# Patient Record
Sex: Male | Born: 1990
Health system: Southern US, Community
[De-identification: ages and names within clinical notes are randomized; demographics above are authoritative.]

## PROBLEM LIST (undated history)

## (undated) DIAGNOSIS — N189 Chronic kidney disease, unspecified: Secondary | ICD-10-CM

## (undated) HISTORY — DX: Chronic kidney disease, unspecified: N18.9

---

## 2014-02-17 ENCOUNTER — Encounter (HOSPITAL_COMMUNITY): Payer: Self-pay | Admitting: Emergency Medicine

## 2014-02-17 ENCOUNTER — Emergency Department (HOSPITAL_COMMUNITY)
Admission: EM | Admit: 2014-02-17 | Discharge: 2014-02-17 | Disposition: A | Discharge status: Home / Self Care | Attending: Emergency Medicine | Admitting: Emergency Medicine

## 2014-02-17 DIAGNOSIS — M545 Low back pain, unspecified: Secondary | ICD-10-CM | POA: Diagnosis present

## 2014-02-17 MED ORDER — OXYCODONE-ACETAMINOPHEN 5-325 MG PO TABS
1.0000 | ORAL_TABLET | Freq: Once | ORAL | Status: AC
  Administered 2014-02-17: 1 via ORAL
  Filled 2014-02-17: qty 1

## 2014-02-17 MED ORDER — DIAZEPAM 5 MG PO TABS: 5.0000 mg | ORAL_TABLET | Freq: Two times a day (BID) | ORAL | Status: DC

## 2014-02-17 MED ORDER — NAPROXEN 500 MG PO TABS: 500.0000 mg | ORAL_TABLET | Freq: Two times a day (BID) | ORAL | Status: DC

## 2014-02-17 MED ORDER — DIAZEPAM 5 MG PO TABS
5.0000 mg | ORAL_TABLET | Freq: Once | ORAL | Status: AC
  Administered 2014-02-17: 5 mg via ORAL
  Filled 2014-02-17: qty 1

## 2014-02-17 NOTE — Discharge Instructions (Signed)
Herniated Disk A herniated disk occurs when a disk in your spine bulges out too far. This condition is also called a ruptured disk or slipped disk. Your spine (backbone) is made up of bones called vertebrae. Between each pair of vertebrae is an oval disk with a soft, spongy center that acts as a shock absorber when you move. The spongy center is surrounded by a tough outer ring. When you have a herniated disk, the spongy center of the disk bulges out or ruptures through the outer ring. A herniated disk can press on a nerve between your vertebrae and cause pain. A herniated disk can occur anywhere in your back or neck area, but the lower back is the most common spot. CAUSES  In many cases, a herniated disk occurs just from getting older. As you age, the spongy insides of your disks tend to shrink and dry out. A herniated disk can result from gradual wear and tear. Injury or sudden strain can also cause a herniated disk.  RISK FACTORS Aging is the main risk factor for a herniated disk. Other risk factors include:  Being a man between the ages of 30 and 50 years.  Having a job that requires heavy lifting, bending, or twisting.  Having a job that requires long hours of driving.  Not getting enough exercise.  Being overweight.  Smoking. SIGNS AND SYMPTOMS  Signs and symptoms depend on which disk is herniated.  For a herniated disk in the lower back, you may have sharp pain in:  One part of your leg, hip, or buttocks.  The back of your calf.  The top or sole of your foot (sciatica).   For a herniated disk in the neck, you may feel pain:  When you move your neck.  Near or over your shoulder blade.  That moves to your upper arm, forearm, or fingers.   You may also have muscle weakness. It may be hard to:  Lift your leg or arm.  Stand on your toes.  Squeeze tightly with one of your hands.  Other symptoms can include:  Numbness or tingling in the affected areas of your  body.  Loss of bladder or bowel control. This is a rare but serious sign of a severe herniated disk in the lower back. DIAGNOSIS  Your health care provider will do a physical exam. During this exam, you may have to move certain body parts or assume various positions. For example, your health care provider may do the straight-leg test. This is a good way to test for a herniated disk in your lower back. In this test, the health care provider lifts your leg while you lie on your back. This is to see if you feel pain down your leg. Your health care provider will also check for numbness or loss of feeling.  Your health care provider will also check your:  Reflexes.  Muscle strength.  Posture.  Other tests may be done to help in making a diagnosis. These may include:  An X-ray of the spine to rule out other causes of back pain.   Other imaging studies, such as an MRI or CT scan. This is to check whether the herniated disk is pressing on your spinal canal.  Electromyography (EMG). This test checks the nerves that control muscles. It is sometimes used to identify the specific area of nerve involvement.  TREATMENT  In many cases, herniated disk symptoms go away over a period of days or weeks. You will most   likely be free of symptoms in 3-4 months. Treatment may include the following:  The initial treatment for a herniated disk is ashort period of rest.  Bed rest is often limited to 1 or 2 days. Resting for too long delays recovery.  If you have a herniated disk in your lower back, you should avoid sitting as much as possible because sitting increases pressure on the disk.  Medicines. These may include:   Nonsteroidal anti-inflammatory drugs (NSAIDs).  Muscle relaxants for back spasms.  Narcotic pain medicine if your pain is very bad.   Steroid injections. You may need these along the involved nerve root to help control pain. The steroid is injected in the area of the herniated disk.  It helps by reducing swelling around the disk.  Physical therapy. This may include exercises to strengthen the muscles that help support your spine.   You may need surgery if other treatments do not work.  HOME CARE INSTRUCTIONS Follow all your health care provider's instructions. These may include:  Take all medicines as directed by your health care provider.  Rest for 2 days and then start moving.  Do not sit or stand for long periods of time.  Maintain good posture when sitting and standing.  Avoid movements that cause pain, such as bending or lifting.  When you are able to start lifting things again:  Bend with your knees.  Keep your back straight.  Hold heavy objects close to your body.  If you are overweight, ask your health care provider to help you start a weight-loss program.  When you are able to start exercising, ask your health care provider how much and what type of exercise is best for you.  Work with a physical therapist on stretching and strengthening exercises for your back.  Do not wear high-heeled shoes.  Do not sleep on your belly.  Do not smoke.  Keep all follow-up visits as directed by your health care provider. SEEK MEDICAL CARE IF:  You have back or neck pain that is not getting better after 4 weeks.  You have very bad pain in your back or neck.  You develop numbness, tingling, or weakness along with pain. SEEK IMMEDIATE MEDICAL CARE IF:   You have numbness, tingling, or weakness that makes you unable to use your arms or legs.  You lose control of your bladder or bowels.  You have dizziness or fainting.  You have shortness of breath.  MAKE SURE YOU:   Understand these instructions.  Will watch your condition.  Will get help right away if you are not doing well or get worse. Document Released: 05/07/2000 Document Revised: 09/24/2013 Document Reviewed: 04/13/2013 Exit[employer] Patient Information 2015 [employer]. This information  is not intended to replace advice given to you by your health care provider. Make sure you discuss any questions you have with your health care provider.  

## 2014-02-17 NOTE — ED Provider Notes (Signed)
CSN: [id number]     Arrival date & time 02/17/14  1931 History   None    This chart was scribed for non-physician practitioner, Antony Madura, PA-C working with No att. providers found by Arlan Organ, ED Scribe. This patient was seen in room WTR6/WTR6 and the patient's care was started at 9:04 PM.   Chief Complaint  Patient presents with  . Back Pain   The history is provided by the patient. No language interpreter was used.    HPI Comments: Tony Holmes is a 23 y.o. male who presents to the Emergency Department complaining of constant, moderate lower back pain onset 8 months. Pt states pain has progressively worsened in last 2 days. He admits to intermittent shooting sensation down the L lower extremity.  He denies any recent injury or trauma but noted discomfort after playing a game of basketball. Pain is exacerbated when activating muscles. No alleviating factors at this time. [name] has not tried any OTC medications to help manage symptoms. However, he has tried ice and heat application to his back without any relief. He denies any fever or chills. No numbness, loss of sensation, or weakness to the lower extremities. No urinary or bowel incontinence. Pt denies a history of cancer or IV drug use. No known allergies to medications.  History reviewed. No pertinent past medical history. History reviewed. No pertinent past surgical history. No family history on file. History  Substance Use Topics  . Smoking status: Never Smoker   . Smokeless tobacco: Not on file  . Alcohol Use: Yes     Comment: occasional    Review of Systems  Constitutional: Negative for fever and chills.  Genitourinary: Negative for dysuria.  Musculoskeletal: Positive for back pain.  Neurological: Negative for weakness and numbness.  All other systems reviewed and are negative.   Allergies  Review of patient's allergies indicates no known allergies.  Home Medications   Prior to Admission medications    Medication Sig Start Date End Date Taking? Authorizing Provider  diazepam (VALIUM) 5 MG tablet Take 1 tablet (5 mg total) by mouth 2 (two) times daily. 02/17/14   Antony Madura, PA-C  naproxen (NAPROSYN) 500 MG tablet Take 1 tablet (500 mg total) by mouth 2 (two) times daily. 02/17/14   Antony Madura, PA-C   Triage Vitals: BP 127/60  Pulse 66  Temp(Src) 98.2 F (36.8 C) (Oral)  Resp 16  Ht 6' (1.829 m)  Wt 205 lb (92.987 kg)  BMI 27.80 kg/m2  SpO2 98%   Physical Exam  Nursing note and vitals reviewed. Constitutional: He is oriented to person, place, and time. He appears well-developed and well-nourished. No distress.  Nontoxic/nonseptic appearing  HENT:  Head: Normocephalic and atraumatic.  Eyes: Conjunctivae and EOM are normal. No scleral icterus.  Neck: Normal range of motion. Neck supple.  Cardiovascular: Normal rate, regular rhythm and intact distal pulses.   DP and PT pulses 2+ bilaterally  Pulmonary/Chest: Effort normal. No respiratory distress.  Musculoskeletal: Normal range of motion.  No tenderness to palpation to the thoracic or lumbar midline. No bony deformities, step-off, or crepitus. Normal range of motion back appreciated. No distinct paraspinal muscle tenderness. No spasm. Patient has a positive straight leg raise and crossed straight-leg raise which elicits pain in left low back  Neurological: He is alert and oriented to person, place, and time. He exhibits normal muscle tone. Coordination normal.  Sensation to light touch intact. Patellar and Achilles reflexes 2+ bilaterally. Patient ambulatory with normal gait.  GCS 15.  Skin: Skin is warm and dry. No rash noted. He is not diaphoretic. No erythema. No pallor.  Psychiatric: He has a normal mood and affect. His behavior is normal.    ED Course  Procedures (including critical care time)  DIAGNOSTIC STUDIES: Oxygen Saturation is 98% on RA, Normal by my interpretation.    COORDINATION OF CARE: 9:04 PM-Discussed  treatment plan with pt at bedside and pt agreed to plan.     Labs Review Labs Reviewed - No data to display  Imaging Review No results found.   EKG Interpretation None      MDM   Final diagnoses:  Left low back pain, with sciatica presence unspecified    23 year old male presents to the emergency department for back pain. Back pain has been persistent over the last few months, but worsening over the last 3 days. Patient is neurovascularly intact. No direct trauma or injury to back, per patient. Patient ambulates with normal gait. No red flags or signs concerning for cauda equina. Positive straight leg raise and crossed straight-leg raise suggest bulging disc as cause of pain today. Patient will be discharged with supportive treatment as well as orthopedic followup. Return precautions discussed and provided. Patient agreeable to plan with no unaddressed concerns. Patient discharged in good condition; VSS.  I personally performed the services described in this documentation, which was scribed in my presence. The recorded information has been reviewed and is accurate.    Filed Vitals:   02/17/14 1939  BP: 127/60  Pulse: 66  Temp: 98.2 F (36.8 C)  TempSrc: Oral  Resp: 16  Height: 6' (1.829 m)  Weight: 205 lb (92.987 kg)  SpO2: 98%     Antony Madura, PA-C 02/17/14 2110

## 2014-02-17 NOTE — ED Notes (Addendum)
Pt reports onset lower back pain on Friday, started after finishing a game of basketball. Pt denies any known trauma/ injury to the area. Pt states the pain is constant and becomes sharp when he moves a certain way. Pain better when walking, worse with sitting still. Pt ambulatory with steady gait, NAD noted.

## 2014-02-17 NOTE — ED Provider Notes (Signed)
Medical screening examination/treatment/procedure(s) were performed by non-physician practitioner and as supervising physician I was immediately available for consultation/collaboration.   EKG Interpretation None        Deatrice Spanbauer, MD 02/17/14 2324 

## 2015-01-20 ENCOUNTER — Ambulatory Visit
Admission: RE | Admit: 2015-01-20 | Discharge: 2015-01-20 | Disposition: A | Discharge status: Home / Self Care | Payer: No Typology Code available for payment source | Source: Ambulatory Visit | Attending: Occupational Medicine | Admitting: Occupational Medicine

## 2015-01-20 ENCOUNTER — Other Ambulatory Visit: Payer: Self-pay | Admitting: Occupational Medicine

## 2015-01-20 DIAGNOSIS — Z021 Encounter for pre-employment examination: Secondary | ICD-10-CM

## 2015-12-15 ENCOUNTER — Ambulatory Visit (HOSPITAL_COMMUNITY)
Admission: EM | Admit: 2015-12-15 | Discharge: 2015-12-15 | Disposition: A | Discharge status: Home / Self Care | Payer: Worker's Compensation | Attending: Internal Medicine | Admitting: Internal Medicine

## 2015-12-15 ENCOUNTER — Encounter (HOSPITAL_COMMUNITY): Payer: Self-pay | Admitting: Emergency Medicine

## 2015-12-15 DIAGNOSIS — S0501XA Injury of conjunctiva and corneal abrasion without foreign body, right eye, initial encounter: Secondary | ICD-10-CM

## 2015-12-15 MED ORDER — TETRACAINE HCL 0.5 % OP SOLN
OPHTHALMIC | Status: AC
  Filled 2015-12-15: qty 2

## 2015-12-15 MED ORDER — CIPROFLOXACIN HCL 0.3 % OP OINT: TOPICAL_OINTMENT | OPHTHALMIC | 0 refills | Status: AC

## 2015-12-15 MED ORDER — CIPROFLOXACIN HCL 0.3 % OP SOLN: 1.0000 [drp] | Freq: Four times a day (QID) | OPHTHALMIC | 0 refills | Status: DC

## 2015-12-15 NOTE — ED Triage Notes (Signed)
The patient presented to the [facility] with a complaint of a possible foreign object in his right eye. The patient stated that a piece of debris fell off of a ladder and landed in his eye. He stated that he did flush it with water but it appeared that it is still in the eye.

## 2016-11-30 IMAGING — CR DG CHEST 1V
1 series · 1 of 1 positions shown · non-contrast
Comparison: None.

CLINICAL DATA: Pre-employment physical examination

EXAM:
CHEST  1 VIEW

[w chest pa]
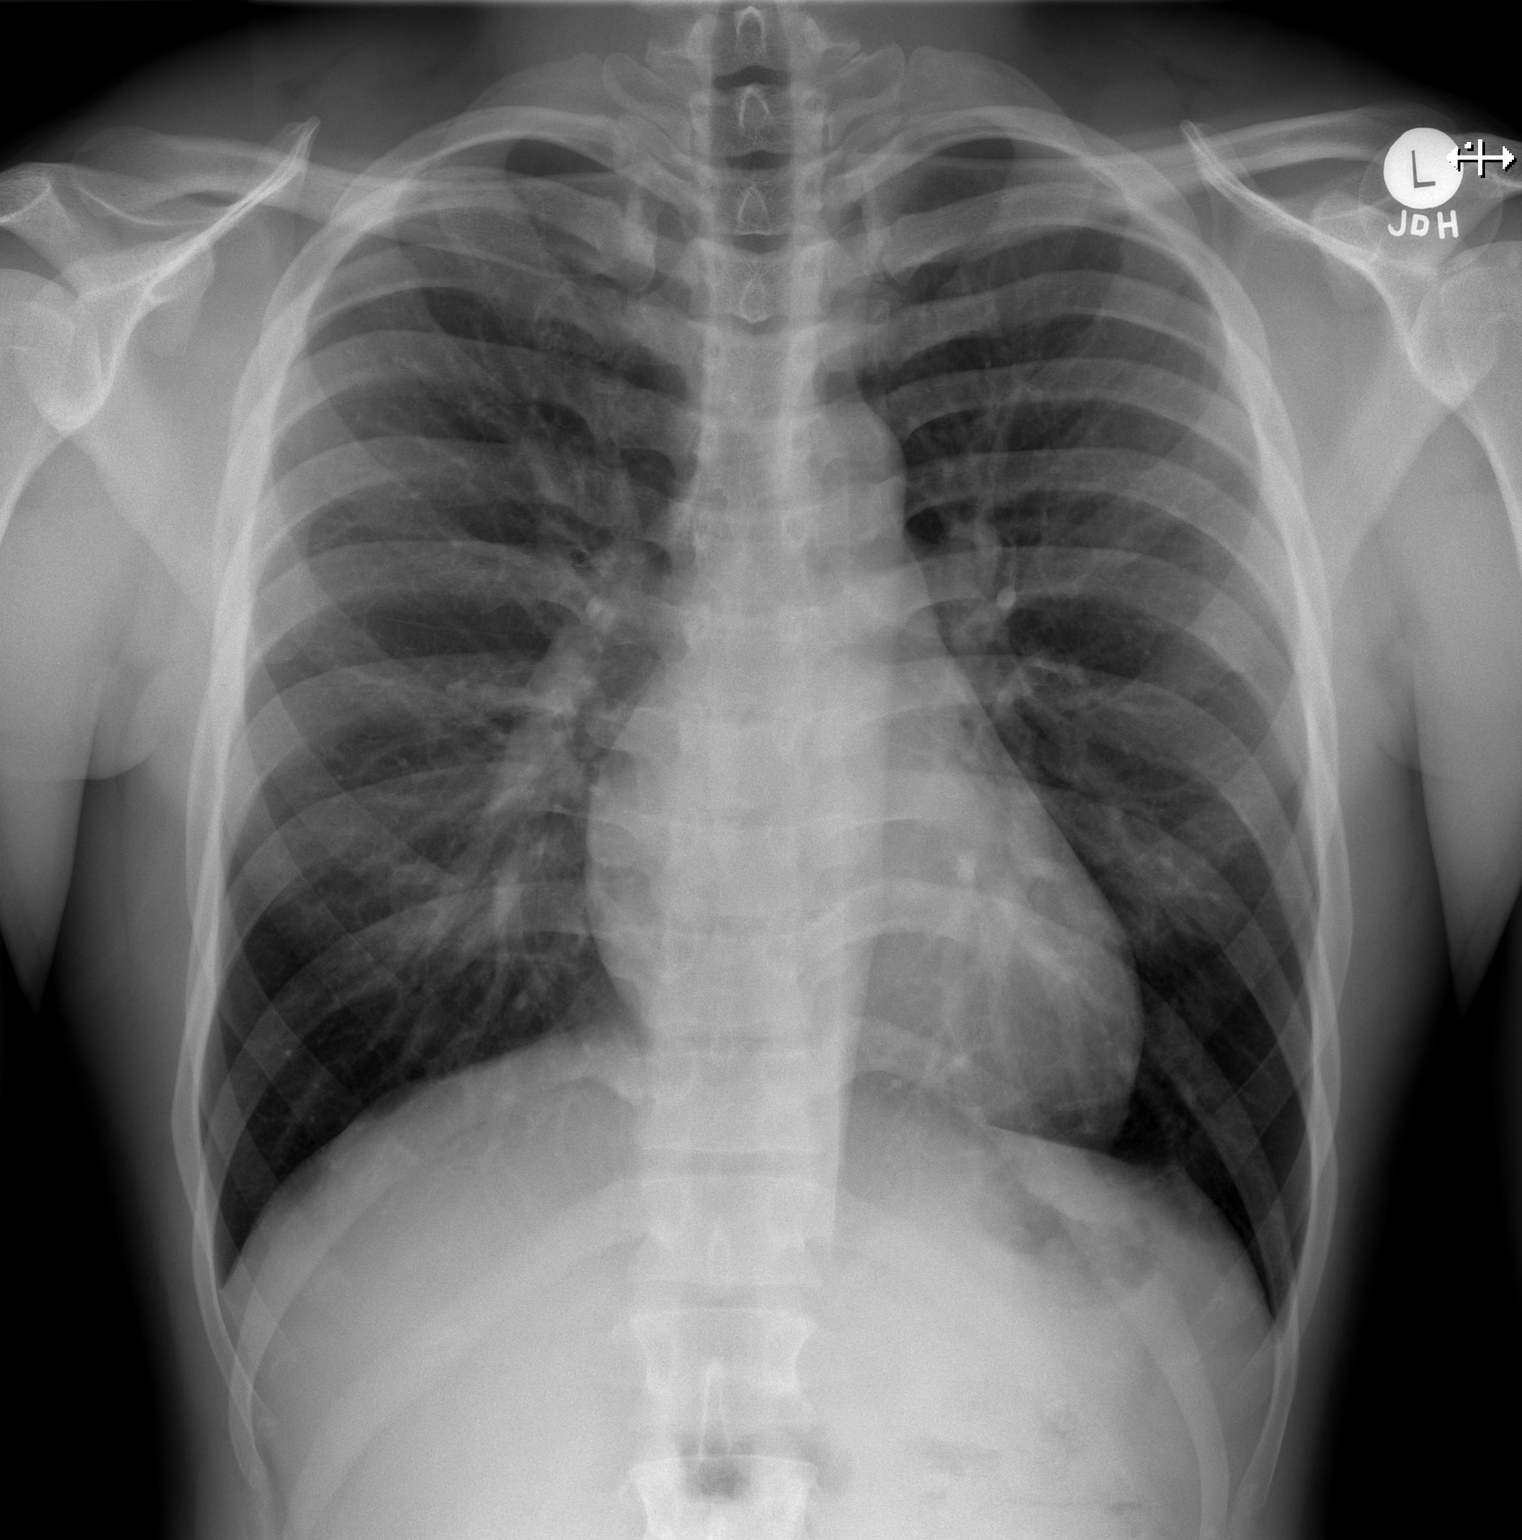

[1 of 1 positions shown; findings below may reference images not displayed]

FINDINGS: Lungs are clear. Heart size and pulmonary vascularity are normal. No
adenopathy. No bone lesions.
IMPRESSION: No abnormality noted.

## 2019-10-03 ENCOUNTER — Ambulatory Visit: Payer: 59 | Attending: Family Medicine | Admitting: Physical Therapy

## 2019-10-03 ENCOUNTER — Other Ambulatory Visit: Payer: Self-pay

## 2019-10-03 DIAGNOSIS — M5442 Lumbago with sciatica, left side: Secondary | ICD-10-CM | POA: Insufficient documentation

## 2019-10-03 DIAGNOSIS — G8929 Other chronic pain: Secondary | ICD-10-CM | POA: Diagnosis present

## 2019-10-03 DIAGNOSIS — M6281 Muscle weakness (generalized): Secondary | ICD-10-CM | POA: Insufficient documentation

## 2019-10-03 NOTE — Therapy (Signed)
[facility] [address] [place], [place], [address] Phone: [phone]   Fax:  [phone]  Physical Therapy Evaluation  Patient Details  Name: Tony Holmes MRN: [id number] Date of Birth: Sep 16, 1990 Referring Provider (PT): Collie Siad   Encounter Date: 10/03/2019  PT End of Session - 10/03/19 0854    Visit Number  1    Date for PT Re-Evaluation  12/03/19    PT Start Time  0755    PT Stop Time  0845    PT Time Calculation (min)  50 min    Activity Tolerance  Patient tolerated treatment well    Behavior During Therapy  [facility] for tasks assessed/performed       No past medical history on file.  No past surgical history on file.  There were no vitals filed for this visit.   Subjective Assessment - 10/03/19 0803    Subjective  Pt reports LBP beginning 2014/2015 while working for [employer]. 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 Pt stated that this last time he hurt his back was after sitting for a long period on a drive; started feeling tightness in his back again. Pt really noticed the pain when he was going over potholes. Pt stated that he has been told that he has a bulging disc. Pt states that he does have radiating pain in LLE down posterior part of leg. Pt report no numbness/tingling. Pt reports he is having trouble putting on socks and bending over to get things d/t pain.    Limitations  Sitting;Lifting;Standing;Walking;House hold activities    How long can you sit comfortably?  <15 minutes    How long can you stand comfortably?  <1 hour    How long can you walk comfortably?  depends; sometimes <5 minutes before muscle spasm    Patient Stated Goals  get rid of pain; be able to go through work day without back problems    Currently in Pain?  Yes    Pain Score  1     Pain Location  Back    Pain Orientation  Left;Lower    Pain Descriptors / Indicators   Sharp;Aching    Pain Type  Chronic pain    Pain Radiating Towards  LLE posterior thigh    Pain Onset  More than a month ago    Pain Frequency  Intermittent    Aggravating Factors   bending, lifting, prolonged sitting, walking    Pain Relieving Factors  ice, heat, ibuprofen         [facility] PT Assessment - 10/03/19 0001      Assessment   Medical Diagnosis  LBP    Referring Provider (PT)  Collie Siad    Prior Therapy  None      Precautions   Precautions  None      Restrictions   Weight Bearing Restrictions  No      Balance Screen   Has the patient fallen in the past 6 months  No    Has the patient had a decrease in activity level because of a fear of falling?   No    Is the patient reluctant to leave their home because of a fear of falling?   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[employer]    Additional Comments  reports no N/T      Functional Tests   Functional tests  Squat;Single Leg Squat;Single leg stance      Squat   Comments  some shaking at bottom of squat noted      Single Leg Squat   Comments  adduction of knee B       Single Leg Stance   Comments  unable to hold for 10 sec on L      ROM / Strength   AROM / PROM / Strength  AROM;Strength      AROM   Overall AROM Comments  Lumbar flexion to knees before onset of pain, reports of radiating pain with lumbar extension, lumbar/hip/knee ROM otherwise [facility]      Strength   Overall Strength Comments  LE 5/5 globally, B hip extension/abduction 4/5      Flexibility   Soft Tissue Assessment /Muscle Length  yes    Hamstrings  very limited on L, illicits pain in L LB    Quadriceps  mildly impaired B    ITB  WFL    Piriformis  WFL B      Palpation    Palpation comment  mildly tender to palpation L glute/piriformis      Ambulation/Gait   Gait Comments  Gait WFL                Objective measurements completed on examination: See above findings.      [facility] Adult PT Treatment/Exercise - 10/03/19 0001      Exercises   Exercises  Lumbar;Knee/Hip      Lumbar Exercises: Quadruped   Opposite Arm/Leg Raise  Right arm/Left leg;Left arm/Right leg;10 reps;3 seconds    Opposite Arm/Leg Raise Limitations  Modified to just arm then just leg d/t instability and core weakness    Plank  side plank on elbows    Other Quadruped Lumbar Exercises  child's pose fwd/lateral      Knee/Hip Exercises: Stretches   Active Hamstring Stretch  Both;30 seconds    Active Hamstring Stretch Limitations  long sitting             PT Education - 10/03/19 0852    Education Details  Pt educated on condition, rehab process, and HEP    Person(s) Educated  Patient    Methods  Explanation;Demonstration;Handout    Comprehension  Verbalized understanding;Returned demonstration       PT Short Term Goals - 10/03/19 1032      PT SHORT TERM GOAL #1   Title  Pt will be independent with HEP    Time  2    Period  Weeks    Status  New    Target Date  10/17/19        PT Long Term Goals - 10/03/19 1032      PT LONG TERM GOAL #1   Title  Pt will demonstrate lumbar flexion WFL and no reports of increased pain    Time  6    Period  Weeks    Status  New    Target Date  11/14/19      PT LONG TERM GOAL #2   Title  Pt will report resolution of radiating pain  in LLE    Time  6    Period  Weeks    Status  New    Target Date  11/14/19      PT LONG TERM GOAL #3   Title  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 Pt demonstrates limited and painful lumbar flexion, mod hamstring tightness/pain on L, and core weakness. 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 Pt would benefit from skilled PT to address the above functional deficits.    Personal Factors and Comorbidities  Fitness;Past/Current Experience;Profession    Examination-Activity Limitations  Bend;Carry;Lift;Stand;Squat;Sit    Examination-Participation Restrictions  Community Activity;Driving;Yard Work    Public affairs consultant  Low    Rehab Potential  Excellent    PT Frequency  2x / week    PT Duration  6 weeks    PT Treatment/Interventions  ADLs/Self Care Home Management;Electrical Stimulation;Iontophoresis 4mg /ml Dexamethasone;Moist Heat;Traction;Neuromuscular re-education;Balance training;Therapeutic exercise;Therapeutic activities;Functional mobility training;Gait training;Patient/family education;Manual techniques;Dry needling;Passive range of motion    PT Next Visit Plan  Review HEP, Initiate LE strengthening/flexibility, core/lumbar stab ex's, manual/modalities as indicated    PT Home Exercise Plan  bird dog, child's pose fwd/lat, hamstring stretch longsitting, side plank on elbow    Consulted and Agree with Plan of Care  Patient       Patient will benefit from skilled therapeutic intervention in order to improve the following deficits and impairments:  Decreased range of motion, Difficulty walking, Decreased endurance, Increased muscle spasms, Decreased activity tolerance, Pain, Decreased balance, Hypomobility, Impaired flexibility, Improper body mechanics, Decreased mobility, Decreased  strength  Visit Diagnosis: Muscle weakness (generalized)  Chronic left-sided low back pain with left-sided sciatica     Problem List There are no problems to display for this patient.  Amador Cunas, PT, DPT Donald Prose Harbert Fitterer 10/03/2019, 10:37 AM  [address] [place], [place], [address] Phone: [phone]   Fax:  [phone]  Name: Tony Holmes MRN: [id number] Date of Birth: Jul 31, 1990

## 2019-10-03 NOTE — Addendum Note (Signed)
Addended by: Marin Roberts on: 10/03/2019 10:39 AM   Modules accepted: Orders

## 2019-10-03 NOTE — Patient Instructions (Signed)
Access Code: [email or web address] URL: [email or web address] Date: 10/03/2019 Prepared by: Lysle Rubens  Exercises Child's Pose Stretch - 1 x daily - 7 x weekly - 3 sets - 3 reps - 20 sec hold Child's Pose with Sidebending - 1 x daily - 7 x weekly - 3 sets - 3 reps - 20 sec hold Bird Dog - 1 x daily - 7 x weekly - 3 sets - 10 reps - 3 sec hold Side Plank on Elbow - 1 x daily - 7 x weekly - 3 sets - 5 reps - 20 sec hold Seated Table Hamstring Stretch - 1 x daily - 7 x weekly - 3 sets - 3 reps - 30 sec hold

## 2019-10-04 ENCOUNTER — Ambulatory Visit: Payer: Commercial Managed Care - HMO | Admitting: Family Medicine

## 2019-10-09 ENCOUNTER — Encounter: Payer: Self-pay | Admitting: Physical Therapy

## 2019-10-09 ENCOUNTER — Other Ambulatory Visit: Payer: Self-pay

## 2019-10-09 ENCOUNTER — Ambulatory Visit: Payer: 59 | Admitting: Physical Therapy

## 2019-10-09 DIAGNOSIS — M6281 Muscle weakness (generalized): Secondary | ICD-10-CM

## 2019-10-09 DIAGNOSIS — G8929 Other chronic pain: Secondary | ICD-10-CM

## 2019-10-09 NOTE — Therapy (Signed)
[place], [place], [address] Phone: [phone]   Fax:  [phone]  Physical Therapy Treatment  Patient Details  Name: Reuven Braver MRN: [id number] Date of Birth: Mar 26, 1991 Referring Provider (PT): Delia Chimes   Encounter Date: 10/09/2019  PT End of Session - 10/09/19 1611    Visit Number  2    Date for PT Re-Evaluation  12/03/19    PT Start Time  5809    PT Stop Time  1612    PT Time Calculation (min)  41 min    Activity Tolerance  Patient tolerated treatment well    Behavior During Therapy  [facility] for tasks assessed/performed       History reviewed. No pertinent past medical history.  History reviewed. No pertinent surgical history.  There were no vitals filed for this visit.  Subjective Assessment - 10/09/19 1535    Subjective  Patient reports that the stretches he was given have been good, reports struggling with the HS stretches    Currently in Pain?  No/denies                        [facility] Adult PT Treatment/Exercise - 10/09/19 0001      Lumbar Exercises: Stretches   Passive Hamstring Stretch  Right;Left;4 reps;20 seconds    Quad Stretch  Right;Left;3 reps;10 seconds    Quad Stretch Limitations  in prone    Piriformis Stretch  Right;Left;3 reps;20 seconds      Lumbar Exercises: Aerobic   Elliptical  I=10, R=6 x 5 minutes      Lumbar Exercises: Machines for Strengthening   Leg Press  80# 2x15    Other Lumbar Machine Exercise  seated row 55, lats 55# 2x15, 35# AR press    Other Lumbar Machine Exercise  15# straight arm pulls 2x15, 15# hip extension and abduction      Lumbar Exercises: Prone   Other Prone Lumbar Exercises  prone on elbows 1 minute, then 3 press ups hold 3 seconds               PT Short Term Goals - 10/09/19 1614      PT SHORT TERM GOAL #1   Title  Pt will be independent with HEP    Status  Achieved        PT Long Term  Goals - 10/03/19 1032      PT LONG TERM GOAL #1   Title  Pt will demonstrate lumbar flexion WFL and no reports of increased pain    Time  6    Period  Weeks    Status  New    Target Date  11/14/19      PT LONG TERM GOAL #2   Title  Pt will report resolution of radiating pain in LLE    Time  6    Period  Weeks    Status  New    Target Date  11/14/19      PT LONG TERM GOAL #3   Title  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  Tried some prone on elbows and some prone press ups.  He reported feeling good when he left.    PT Next Visit Plan  continue what we started today and add education for body mechanics with job and ADL's    Consulted and Agree with Plan of Care  Patient       Patient will benefit from skilled therapeutic intervention in order to improve the following deficits and impairments:  Decreased range of motion, Difficulty walking, Decreased endurance, Increased muscle spasms, Decreased activity tolerance, Pain, Decreased balance, Hypomobility, Impaired flexibility, Improper body mechanics, Decreased mobility, Decreased strength  Visit Diagnosis: Chronic left-sided low back pain with left-sided sciatica  Muscle weakness (generalized)     Problem List There are no problems to display for this patient.   Jearld Lesch., PT 10/09/2019, 4:15 PM  [facility] [place] [address] [place], [place], [address] Phone: [phone]   Fax:  [phone]  Name: Casimiro Lienhard MRN: [id number] Date of Birth: 03/17/1991

## 2019-10-15 ENCOUNTER — Other Ambulatory Visit: Payer: Self-pay

## 2019-10-15 ENCOUNTER — Ambulatory Visit: Payer: 59 | Admitting: Physical Therapy

## 2019-10-15 ENCOUNTER — Encounter: Payer: Self-pay | Admitting: Physical Therapy

## 2019-10-15 DIAGNOSIS — M6281 Muscle weakness (generalized): Secondary | ICD-10-CM | POA: Diagnosis not present

## 2019-10-15 DIAGNOSIS — M5442 Lumbago with sciatica, left side: Secondary | ICD-10-CM

## 2019-10-15 NOTE — Therapy (Signed)
[facility] [place] [address] [place], [place], [address] Phone: [phone]   Fax:  [phone]  Physical Therapy Treatment  Patient Details  Name: Tony Holmes MRN: [id number] Date of Birth: Feb 18, 1991 Referring Provider (PT): Collie Siad   Encounter Date: 10/15/2019  PT End of Session - 10/15/19 1357    Visit Number  3    Date for PT Re-Evaluation  12/03/19    PT Start Time  1315    PT Stop Time  1400    PT Time Calculation (min)  45 min    Activity Tolerance  Patient tolerated treatment well    Behavior During Therapy  [facility] for tasks assessed/performed       History reviewed. No pertinent past medical history.  History reviewed. No pertinent surgical history.  There were no vitals filed for this visit.  Subjective Assessment - 10/15/19 1316    Subjective  Pt reports HEP has been going well; is a little stiff from driving car this weekend but otherwise feeling good.    Currently in Pain?  No/denies    Pain Score  0-No pain    Pain Location  Back                        [facility] Adult PT Treatment/Exercise - 10/15/19 0001      Lumbar Exercises: Aerobic   Elliptical  I=10, R=6 x 4 min fwd/3 min bkwd      Lumbar Exercises: Machines for Strengthening   Cybex Knee Extension  35# 2x15    Cybex Knee Flexion  55# 2x15    Leg Press  80# 2x15    Other Lumbar Machine Exercise  seated row 55, lats 55# 2x15, 35# AR press, heel raises 80# 2x15    Other Lumbar Machine Exercise  15# standing shoulder extension 2x15 B      Lumbar Exercises: Supine   Dead Bug  20 reps;3 seconds    Single Leg Bridge  20 reps;3 seconds      Lumbar Exercises: Quadruped   Plank  plank with 30 sec hold               PT Short Term Goals - 10/09/19 1614      PT SHORT TERM GOAL #1   Title  Pt will be independent with HEP    Status  Achieved        PT Long Term Goals - 10/03/19 1032      PT LONG TERM GOAL #1    Title  Pt will demonstrate lumbar flexion WFL and no reports of increased pain    Time  6    Period  Weeks    Status  New    Target Date  11/14/19      PT LONG TERM GOAL #2   Title  Pt will report resolution of radiating pain in LLE    Time  6    Period  Weeks    Status  New    Target Date  11/14/19      PT LONG TERM GOAL #3   Title  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 Verbal cues for form with some ex's. Continue to progress next rx.    PT Treatment/Interventions  ADLs/Self Care Home Management;Electrical Stimulation;Iontophoresis 4mg /ml Dexamethasone;Moist Heat;Traction;Neuromuscular re-education;Balance training;Therapeutic exercise;Therapeutic activities;Functional mobility training;Gait training;Patient/family education;Manual techniques;Dry needling;Passive range of motion    PT Next Visit Plan  continue what we started today and add education for body mechanics with job and ADL's    Consulted and Agree with Plan of Care  Patient       Patient will benefit from skilled therapeutic intervention in order to improve the following deficits and impairments:  Decreased range of motion, Difficulty walking, Decreased endurance, Increased muscle spasms, Decreased activity tolerance, Pain, Decreased balance, Hypomobility, Impaired flexibility, Improper body mechanics, Decreased mobility, Decreased strength  Visit Diagnosis: Chronic left-sided low back pain with left-sided sciatica  Muscle weakness (generalized)     Problem  List There are no problems to display for this patient.  Amador Cunas, PT, DPT Donald Prose Ishaaq Penna 10/15/2019, 2:00 PM  [place] [address] [place], [place], [address] Phone: [phone]   Fax:  [phone]  Name: Christoffer Currier MRN: [id number] Date of Birth: 01/15/1991

## 2019-10-17 ENCOUNTER — Other Ambulatory Visit: Payer: Self-pay

## 2019-10-17 ENCOUNTER — Ambulatory Visit: Payer: 59 | Admitting: Physical Therapy

## 2019-10-17 ENCOUNTER — Encounter: Payer: Self-pay | Admitting: Physical Therapy

## 2019-10-17 DIAGNOSIS — G8929 Other chronic pain: Secondary | ICD-10-CM

## 2019-10-17 DIAGNOSIS — M6281 Muscle weakness (generalized): Secondary | ICD-10-CM

## 2019-10-17 NOTE — Therapy (Signed)
[facility] [place] [address] [place], [place], [address] Phone: [phone]   Fax:  [phone]  Physical Therapy Treatment  Patient Details  Name: Tony Holmes MRN: [id number] Date of Birth: 1991/05/05 Referring Provider (PT): Collie Siad   Encounter Date: 10/17/2019  PT End of Session - 10/17/19 1358    Visit Number  4    Date for PT Re-Evaluation  12/03/19    PT Start Time  1315    PT Stop Time  1400    PT Time Calculation (min)  45 min    Activity Tolerance  Patient tolerated treatment well    Behavior During Therapy  [facility] for tasks assessed/performed       Past Medical History:  Diagnosis Date  . Chronic kidney disease     History reviewed. No pertinent surgical history.  There were no vitals filed for this visit.  Subjective Assessment - 10/17/19 1311    Subjective  Pt reports no LBP and no stiffness while driving since last rx.    Currently in Pain?  No/denies    Pain Score  0-No pain    Pain Location  Back                        [facility] Adult PT Treatment/Exercise - 10/17/19 0001      Lumbar Exercises: Stretches   Double Knee to Chest Stretch  3 reps;20 seconds    Double Knee to Chest Stretch Limitations  with rotation stretch for piriformis      Lumbar Exercises: Aerobic   Elliptical  I=10, R=6 x 4 min fwd/3 min bkwd      Lumbar Exercises: Machines for Strengthening   Cybex Knee Extension  35# 2x15    Cybex Knee Flexion  55# 2x15    Other Lumbar Machine Exercise  seated row 55, lats 55# 2x15, 35# AR press, heel raises 80# 2x15, lumbar extenstion 2x15 black TB, chest press 55# 2x15    Other Lumbar Machine Exercise  15# standing shoulder extension 2x15 B      Lumbar Exercises: Standing   Other Standing Lumbar Exercises  single leg RDL 18# 1x10 B      Lumbar Exercises: Supine   Dead Bug  20 reps;3 seconds    Dead Bug Limitations  with exercise ball    Single Leg Bridge  20  reps;3 seconds      Lumbar Exercises: Sidelying   Other Sidelying Lumbar Exercises  side plank on elbows x30 sec B      Lumbar Exercises: Quadruped   Opposite Arm/Leg Raise  Right arm/Left leg;Left arm/Right leg;10 reps;3 seconds               PT Short Term Goals - 10/09/19 1614      PT SHORT TERM GOAL #1   Title  Pt will be independent with HEP    Status  Achieved        PT Long Term Goals - 10/03/19 1032      PT LONG TERM GOAL #1   Title  Pt will demonstrate lumbar flexion WFL and no reports of increased pain    Time  6    Period  Weeks    Status  New    Target Date  11/14/19      PT LONG TERM GOAL #2   Title  Pt will report resolution of radiating pain in LLE    Time  6    Period  Weeks    Status  New    Target Date  11/14/19      PT LONG TERM GOAL #3   Title  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 Verbal cues for form with AR press. Pt instructed to continue with HEP and incoporate 1-2 gym workouts between now and next rx to see how LB responds. Begin incorporating more functional job training as indicated.    PT Treatment/Interventions  ADLs/Self Care Home Management;Electrical Stimulation;Iontophoresis 4mg /ml Dexamethasone;Moist Heat;Traction;Neuromuscular re-education;Balance training;Therapeutic exercise;Therapeutic activities;Functional mobility training;Gait training;Patient/family education;Manual techniques;Dry needling;Passive range of motion    PT Next Visit Plan  continue what we started  today and add education for body mechanics with job and ADL's    Consulted and Agree with Plan of Care  Patient       Patient will benefit from skilled therapeutic intervention in order to improve the following deficits and impairments:  Decreased range of motion, Difficulty walking, Decreased endurance, Increased muscle spasms, Decreased activity tolerance, Pain, Decreased balance, Hypomobility, Impaired flexibility, Improper body mechanics, Decreased mobility, Decreased strength  Visit Diagnosis: Chronic left-sided low back pain with left-sided sciatica  Muscle weakness (generalized)     Problem List There are no problems to display for this patient.  Amador Cunas, PT, DPT Donald Prose Rayya Yagi 10/17/2019, 2:10 PM  [place] [address] [place], [place], [address] Phone: [phone]   Fax:  [phone]  Name: Tony Holmes MRN: [id number] Date of Birth: [birth year]

## 2019-10-24 ENCOUNTER — Encounter: Payer: Self-pay | Admitting: Physical Therapy

## 2019-10-24 ENCOUNTER — Ambulatory Visit: Payer: 59 | Attending: Family Medicine | Admitting: Physical Therapy

## 2019-10-24 ENCOUNTER — Other Ambulatory Visit: Payer: Self-pay

## 2019-10-24 DIAGNOSIS — G8929 Other chronic pain: Secondary | ICD-10-CM | POA: Diagnosis present

## 2019-10-24 DIAGNOSIS — M6281 Muscle weakness (generalized): Secondary | ICD-10-CM

## 2019-10-24 DIAGNOSIS — M5442 Lumbago with sciatica, left side: Secondary | ICD-10-CM | POA: Insufficient documentation

## 2019-10-24 NOTE — Therapy (Signed)
[facility] [place] [address] [place], [place], [address] Phone: [phone]   Fax:  [phone]  Physical Therapy Treatment  Patient Details  Name: Tony Holmes MRN: [id number] Date of Birth: 10-01-1990 Referring Provider (PT): Collie Siad   Encounter Date: 10/24/2019  PT End of Session - 10/24/19 1359    Visit Number  5    Date for PT Re-Evaluation  12/03/19    PT Start Time  1315    PT Stop Time  1400    PT Time Calculation (min)  45 min    Activity Tolerance  Patient tolerated treatment well    Behavior During Therapy  [facility] for tasks assessed/performed       Past Medical History:  Diagnosis Date  . Chronic kidney disease     History reviewed. No pertinent surgical history.  There were no vitals filed for this visit.  Subjective Assessment - 10/24/19 1316    Subjective  Pt reports no LBP and stiffness since last rx; pt was able to complete full gym workout and ellipitical workout without increasing LBP    Currently in Pain?  No/denies    Pain Score  0-No pain    Pain Location  Back                        [facility] Adult PT Treatment/Exercise - 10/24/19 0001      Lumbar Exercises: Aerobic   Elliptical  I=10, R=6 x 4 min fwd/3 min bkwd      Lumbar Exercises: Machines for Strengthening   Cybex Knee Extension  35# 2x10    Cybex Knee Flexion  55# 2x10    Leg Press  100# 2x10    Other Lumbar Machine Exercise  seated row 55, lats 75# 2x15, 35# AR press, heel raises 100# 2x15, lumbar extenstion 2x15 black TB, chest press 55# 2x15    Other Lumbar Machine Exercise  20# standing shoulder ext 2x15 B               PT Short Term Goals - 10/09/19 1614      PT SHORT TERM GOAL #1   Title  Pt will be independent with HEP    Status  Achieved        PT Long Term Goals - 10/03/19 1032      PT LONG TERM GOAL #1   Title  Pt will demonstrate lumbar flexion WFL and no reports of increased pain     Time  6    Period  Weeks    Status  New    Target Date  11/14/19      PT LONG TERM GOAL #2   Title  Pt will report resolution of radiating pain in LLE    Time  6    Period  Weeks    Status  New    Target Date  11/14/19      PT LONG TERM GOAL #3   Title  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 Pt has returned to work with no LBP and reports completing full gym workouts with no increased pain. Pt instructed to continue to workout this week and complete HEP with check in one week from today. Potential for d/c at next rx.    PT Treatment/Interventions  ADLs/Self Care Home Management;Electrical Stimulation;Iontophoresis 4mg /ml Dexamethasone;Moist Heat;Traction;Neuromuscular re-education;Balance training;Therapeutic exercise;Therapeutic activities;Functional mobility training;Gait training;Patient/family education;Manual techniques;Dry needling;Passive range of motion    PT Next Visit Plan  continue what we started today and add education for body mechanics with job and ADL's    Consulted and Agree with Plan of Care  Patient       Patient will benefit from skilled therapeutic intervention in order to improve the following deficits and impairments:  Decreased range of motion, Difficulty walking, Decreased endurance, Increased muscle spasms, Decreased activity tolerance, Pain, Decreased balance, Hypomobility, Impaired flexibility, Improper body mechanics, Decreased mobility, Decreased strength  Visit Diagnosis: Chronic left-sided low back pain with left-sided  sciatica  Muscle weakness (generalized)     Problem List There are no problems to display for this patient.  Amador Cunas, PT, DPT Donald Prose Francyne Arreaga 10/24/2019, 2:01 PM  [place] [address] [place], [place], [address] Phone: [phone]   Fax:  [phone]  Name: Tony Holmes MRN: [id number] Date of Birth: 1990/06/23

## 2019-10-26 ENCOUNTER — Encounter: Payer: 59 | Admitting: Physical Therapy

## 2019-10-29 ENCOUNTER — Encounter: Payer: Self-pay | Admitting: Physical Therapy

## 2019-10-29 ENCOUNTER — Ambulatory Visit: Payer: 59 | Admitting: Physical Therapy

## 2019-10-29 ENCOUNTER — Other Ambulatory Visit: Payer: Self-pay

## 2019-10-29 DIAGNOSIS — G8929 Other chronic pain: Secondary | ICD-10-CM

## 2019-10-29 DIAGNOSIS — M5442 Lumbago with sciatica, left side: Secondary | ICD-10-CM | POA: Diagnosis not present

## 2019-10-29 DIAGNOSIS — M6281 Muscle weakness (generalized): Secondary | ICD-10-CM

## 2019-10-29 NOTE — Therapy (Signed)
[place] [place], [place], [address] Phone: [phone]   Fax:  [phone]  Physical Therapy Treatment PHYSICAL THERAPY DISCHARGE SUMMARY   Plan: Patient agrees to discharge.  Patient goals were met. Patient is being discharged due to meeting the stated rehab goals.  ?????     Patient Details  Name: Tony Holmes MRN: [id number] Date of Birth: 1990/12/03 Referring Provider (PT): Delia Chimes   Encounter Date: 10/29/2019  PT End of Session - 10/29/19 1354    Visit Number  6    Date for PT Re-Evaluation  12/03/19    PT Start Time  1315    PT Stop Time  1357    PT Time Calculation (min)  42 min    Activity Tolerance  Patient tolerated treatment well    Behavior During Therapy  [facility] for tasks assessed/performed       Past Medical History:  Diagnosis Date  . Chronic kidney disease     History reviewed. No pertinent surgical history.  There were no vitals filed for this visit.  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 Pt reports he is ready to d/c.    Pain Score  0-No pain    Pain Location  Back                        [facility] Adult PT Treatment/Exercise - 10/29/19 0001      Lumbar Exercises: Aerobic   Elliptical  I=10, R=6 x 4 min fwd/3 min bkwd      Lumbar Exercises: Machines for Strengthening   Cybex Knee Extension  35# 2x10    Cybex Knee Flexion  55# 2x15    Leg Press  100# 2x10    Other Lumbar Machine Exercise  seated row 65, lats 75# 2x15, 35# AR press, heel raises 100# 2x15, lumbar extenstion 2x15 black TB, chest press 55# 2x15    Other Lumbar Machine Exercise  20# standing shoulder ext 2x15 B             PT Education - 10/29/19 1353    Education Details  Pt educated on d/c, continuance of HEP, and when to return if symptoms recur     Person(s) Educated  Patient    Methods  Explanation    Comprehension  Verbalized understanding       PT Short Term Goals - 10/29/19 1356      PT SHORT TERM GOAL #1   Title  Pt will be independent with HEP    Status  Achieved        PT Long Term Goals - 10/29/19 1357      PT LONG TERM GOAL #1   Title  Pt will demonstrate lumbar flexion WFL and no reports of increased pain    Time  6    Period  Weeks    Status  Achieved      PT LONG TERM GOAL #2   Title  Pt will report resolution of radiating pain in LLE    Time  6    Period  Weeks    Status  Achieved      PT LONG TERM GOAL #3   Title  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 Pt has returned to work with no LBP and is completing full gym workouts and HEP with no increase in pain. Pt educated on continuance of HEP and when to return if symptoms recur.    PT Next Visit Plan  Pt recommended for d/c secondary to meeting all goals with instruction on continuance of HEP    Consulted and Agree with Plan of Care  Patient       Patient will benefit from skilled therapeutic intervention in order to improve the following deficits and impairments:     Visit Diagnosis: Chronic left-sided low back pain with left-sided sciatica  Muscle weakness (generalized)     Problem List There are no problems to display for this patient.  Amador Cunas, PT, DPT Tony Holmes 10/29/2019, 1:57 PM  [place] [address] [place], [place], [address] Phone: [phone]    Fax:  [phone]  Name: Tony Holmes MRN: [id number] Date of Birth: 1990/11/05

## 2019-10-31 ENCOUNTER — Encounter: Payer: 59 | Admitting: Physical Therapy

## 2022-11-11 ENCOUNTER — Ambulatory Visit: Payer: 59 | Admitting: Family Medicine

## 2022-11-11 ENCOUNTER — Encounter: Payer: Self-pay | Admitting: Family Medicine

## 2022-11-11 VITALS — BP 138/82 | HR 85 | Temp 98.0°F | Ht 73.0 in | Wt 245.6 lb

## 2022-11-11 DIAGNOSIS — Z1159 Encounter for screening for other viral diseases: Secondary | ICD-10-CM

## 2022-11-11 DIAGNOSIS — E669 Obesity, unspecified: Secondary | ICD-10-CM | POA: Diagnosis not present

## 2022-11-11 DIAGNOSIS — Z111 Encounter for screening for respiratory tuberculosis: Secondary | ICD-10-CM

## 2022-11-11 DIAGNOSIS — Z77098 Contact with and (suspected) exposure to other hazardous, chiefly nonmedicinal, chemicals: Secondary | ICD-10-CM | POA: Insufficient documentation

## 2022-11-11 DIAGNOSIS — D72819 Decreased white blood cell count, unspecified: Secondary | ICD-10-CM | POA: Diagnosis not present

## 2022-11-11 DIAGNOSIS — Z0289 Encounter for other administrative examinations: Secondary | ICD-10-CM

## 2022-11-11 DIAGNOSIS — G8929 Other chronic pain: Secondary | ICD-10-CM | POA: Insufficient documentation

## 2022-11-11 DIAGNOSIS — T59811A Toxic effect of smoke, accidental (unintentional), initial encounter: Secondary | ICD-10-CM

## 2022-11-11 DIAGNOSIS — E785 Hyperlipidemia, unspecified: Secondary | ICD-10-CM

## 2022-11-11 DIAGNOSIS — M545 Low back pain, unspecified: Secondary | ICD-10-CM

## 2022-11-11 NOTE — Assessment & Plan Note (Signed)
History of recurrent low back pain due to bulging discs, which currently is asymptomatic.  Plan:  Advise on proper lifting techniques to prevent recurrence. Consider physical therapy if symptoms return. Monitor for any signs of exacerbation.

## 2022-11-11 NOTE — Assessment & Plan Note (Signed)
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  Plan:  Recommend connecting [name] with [facility] for specialized evaluation. Order a chest X-ray to monitor for potential respiratory issues due to smoke inhalation. Consider routine blood work, can consider heavy metals screening. Educate about the need for vigilant self-monitoring and regular check-ups.

## 2022-11-11 NOTE — Patient Instructions (Addendum)
Schedule and complete blood work and a chest X-ray one week before your next appointment. Monitor your blood pressure regularly and report any significant changes to your healthcare provider, considering your history of elevated blood pressure. Continue to be vigilant about your exposure to potential toxins such as PFAS and asbestos, and inform your provider of any symptoms like swelling, difficulty breathing, or unexplained pain.  For  xray, go to:    [place] [facility] [address] [place], [place], [place] [address] Phone: [phone]
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
## 2022-11-30 ENCOUNTER — Ambulatory Visit (INDEPENDENT_AMBULATORY_CARE_PROVIDER_SITE_OTHER)
Admission: RE | Admit: 2022-11-30 | Discharge: 2022-11-30 | Disposition: A | Discharge status: Home / Self Care | Payer: 59 | Source: Ambulatory Visit | Attending: Family Medicine | Admitting: Family Medicine

## 2022-11-30 DIAGNOSIS — T59811A Toxic effect of smoke, accidental (unintentional), initial encounter: Secondary | ICD-10-CM | POA: Diagnosis not present

## 2022-11-30 DIAGNOSIS — Z0289 Encounter for other administrative examinations: Secondary | ICD-10-CM | POA: Diagnosis not present

## 2022-12-09 ENCOUNTER — Encounter: Payer: Self-pay | Admitting: Family Medicine

## 2022-12-09 ENCOUNTER — Ambulatory Visit (INDEPENDENT_AMBULATORY_CARE_PROVIDER_SITE_OTHER): Payer: 59 | Admitting: Family Medicine

## 2022-12-09 ENCOUNTER — Other Ambulatory Visit: Payer: Self-pay

## 2022-12-09 ENCOUNTER — Telehealth: Payer: Self-pay | Admitting: Family Medicine

## 2022-12-09 VITALS — BP 122/78 | HR 62 | Temp 96.0°F | Ht 72.0 in | Wt 245.2 lb

## 2022-12-09 DIAGNOSIS — E669 Obesity, unspecified: Secondary | ICD-10-CM

## 2022-12-09 DIAGNOSIS — Z0000 Encounter for general adult medical examination without abnormal findings: Secondary | ICD-10-CM

## 2022-12-09 DIAGNOSIS — Z77098 Contact with and (suspected) exposure to other hazardous, chiefly nonmedicinal, chemicals: Secondary | ICD-10-CM

## 2022-12-09 DIAGNOSIS — D72819 Decreased white blood cell count, unspecified: Secondary | ICD-10-CM

## 2022-12-09 DIAGNOSIS — H6121 Impacted cerumen, right ear: Secondary | ICD-10-CM

## 2022-12-09 DIAGNOSIS — E782 Mixed hyperlipidemia: Secondary | ICD-10-CM | POA: Diagnosis not present

## 2022-12-09 DIAGNOSIS — Z0001 Encounter for general adult medical examination with abnormal findings: Secondary | ICD-10-CM

## 2022-12-09 DIAGNOSIS — Z111 Encounter for screening for respiratory tuberculosis: Secondary | ICD-10-CM

## 2022-12-09 DIAGNOSIS — Z0289 Encounter for other administrative examinations: Secondary | ICD-10-CM

## 2022-12-09 DIAGNOSIS — E785 Hyperlipidemia, unspecified: Secondary | ICD-10-CM

## 2022-12-09 DIAGNOSIS — Z1159 Encounter for screening for other viral diseases: Secondary | ICD-10-CM

## 2022-12-09 DIAGNOSIS — Z23 Encounter for immunization: Secondary | ICD-10-CM

## 2022-12-09 DIAGNOSIS — T59811A Toxic effect of smoke, accidental (unintentional), initial encounter: Secondary | ICD-10-CM

## 2022-12-09 HISTORY — DX: Encounter for general adult medical examination with abnormal findings: Z00.01

## 2022-12-09 HISTORY — DX: Mixed hyperlipidemia: E78.2

## 2022-12-09 LAB — LIPID PANEL
Cholesterol: 237 mg/dL — ABNORMAL HIGH (ref 0–200)
HDL: 52.3 mg/dL (ref >39.00)
LDL Cholesterol: 167 mg/dL — ABNORMAL HIGH (ref 0–99)
NonHDL: 184.93
Total CHOL/HDL Ratio: 5
Triglycerides: 92 mg/dL (ref 0.0–149.0)
VLDL: 18.4 mg/dL (ref 0.0–40.0)

## 2022-12-09 LAB — HEMOGLOBIN A1C: Hgb A1c MFr Bld: 6.2 % (ref 4.6–6.5)

## 2022-12-09 LAB — CBC WITH DIFFERENTIAL/PLATELET
Basophils Absolute: 0 10*3/uL (ref 0.0–0.1)
Basophils Relative: 1.1 % (ref 0.0–3.0)
Eosinophils Absolute: 0 10*3/uL (ref 0.0–0.7)
Eosinophils Relative: 0.4 % (ref 0.0–5.0)
HCT: 44 % (ref 39.0–52.0)
Hemoglobin: 14.2 g/dL (ref 13.0–17.0)
Lymphocytes Relative: 40.4 % (ref 12.0–46.0)
Lymphs Abs: 1.4 10*3/uL (ref 0.7–4.0)
MCHC: 32.2 g/dL (ref 30.0–36.0)
MCV: 81.5 fl (ref 78.0–100.0)
Monocytes Absolute: 0.4 10*3/uL (ref 0.1–1.0)
Monocytes Relative: 12.4 % — ABNORMAL HIGH (ref 3.0–12.0)
Neutro Abs: 1.6 10*3/uL (ref 1.4–7.7)
Neutrophils Relative %: 45.7 % (ref 43.0–77.0)
Platelets: 240 10*3/uL (ref 150.0–400.0)
RBC: 5.39 Mil/uL (ref 4.22–5.81)
RDW: 15.1 % (ref 11.5–15.5)
WBC: 3.6 10*3/uL — ABNORMAL LOW (ref 4.0–10.5)

## 2022-12-09 LAB — URINALYSIS, ROUTINE W REFLEX MICROSCOPIC
Bilirubin Urine: NEGATIVE
Hgb urine dipstick: NEGATIVE
Ketones, ur: NEGATIVE
Leukocytes,Ua: NEGATIVE
Nitrite: NEGATIVE
RBC / HPF: NONE SEEN (ref 0–?)
Specific Gravity, Urine: 1.015 (ref 1.000–1.030)
Total Protein, Urine: NEGATIVE
Urine Glucose: NEGATIVE
Urobilinogen, UA: 0.2 (ref 0.0–1.0)
pH: 7.5 (ref 5.0–8.0)

## 2022-12-09 LAB — COMPREHENSIVE METABOLIC PANEL
ALT: 39 U/L (ref 0–53)
AST: 27 U/L (ref 0–37)
Albumin: 4.6 g/dL (ref 3.5–5.2)
Alkaline Phosphatase: 59 U/L (ref 39–117)
BUN: 18 mg/dL (ref 6–23)
CO2: 28 mEq/L (ref 19–32)
Calcium: 9.9 mg/dL (ref 8.4–10.5)
Chloride: 101 mEq/L (ref 96–112)
Creatinine, Ser: 1.03 mg/dL (ref 0.40–1.50)
GFR: 96.13 mL/min (ref >60.00)
Glucose, Bld: 86 mg/dL (ref 70–99)
Potassium: 4.2 mEq/L (ref 3.5–5.1)
Sodium: 136 mEq/L (ref 135–145)
Total Bilirubin: 1 mg/dL (ref 0.2–1.2)
Total Protein: 7.4 g/dL (ref 6.0–8.3)

## 2022-12-09 LAB — MICROALBUMIN / CREATININE URINE RATIO
Creatinine,U: 116.8 mg/dL
Microalb Creat Ratio: 0.6 mg/g (ref 0.0–30.0)
Microalb, Ur: <0.7 mg/dL (ref 0.0–1.9)

## 2022-12-09 LAB — TSH: TSH: 1.08 u[IU]/mL (ref 0.35–5.50)
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
## 2022-12-09 NOTE — Assessment & Plan Note (Addendum)
Advise using ear drops if the issue recurs.

## 2022-12-09 NOTE — Assessment & Plan Note (Signed)
Patient's BMI is 33, classifying him in the obese category. He is advised to reduce weight through dietary modifications and increased physical activity.  Plan: Continue monitoring weight and BMI at each visit. Provide dietary counseling and recommend a balanced diet and regular exercise.

## 2022-12-10 LAB — HEPATITIS C ANTIBODY: Hepatitis C Ab: NONREACTIVE

## 2022-12-10 LAB — HEPATITIS B CORE ANTIBODY, IGM: Hep B C IgM: NONREACTIVE

## 2022-12-10 NOTE — Telephone Encounter (Signed)
error 

## 2022-12-13 LAB — QUANTIFERON-TB GOLD PLUS
Mitogen-NIL: >10 IU/mL
NIL: 0.03 IU/mL
QuantiFERON-TB Gold Plus: NEGATIVE
TB1-NIL: 0.05 IU/mL
TB2-NIL: 0.03 IU/mL

## 2022-12-13 LAB — HIV ANTIBODY (ROUTINE TESTING W REFLEX): HIV 1&2 Ab, 4th Generation: NONREACTIVE

## 2022-12-13 LAB — VITAMIN D 1,25 DIHYDROXY
Vitamin D 1, 25 (OH)2 Total: 46 pg/mL (ref 18–72)
Vitamin D2 1, 25 (OH)2: <8 pg/mL
Vitamin D3 1, 25 (OH)2: 46 pg/mL

## 2022-12-13 LAB — HEPATITIS B SURFACE ANTIGEN: Hepatitis B Surface Ag: NONREACTIVE

## 2022-12-13 LAB — HEPATITIS B SURFACE ANTIBODY, QUANTITATIVE: Hep B S AB Quant (Post): >1000 m[IU]/mL (ref >=10)

## 2022-12-13 LAB — CHOLINESTERASE, SERUM: Cholinesterase, Serum: 2872 IU/L (ref 1801–3537)

## 2023-06-28 ENCOUNTER — Other Ambulatory Visit: Payer: Self-pay

## 2023-06-28 ENCOUNTER — Ambulatory Visit
Admission: EM | Admit: 2023-06-28 | Discharge: 2023-06-28 | Disposition: A | Discharge status: Home / Self Care | Payer: 59 | Attending: Family Medicine | Admitting: Family Medicine

## 2023-06-28 DIAGNOSIS — J111 Influenza due to unidentified influenza virus with other respiratory manifestations: Secondary | ICD-10-CM | POA: Diagnosis not present

## 2023-06-28 LAB — POCT INFLUENZA A/B
Influenza A, POC: NEGATIVE
Influenza B, POC: NEGATIVE
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
## 2023-06-28 NOTE — ED Triage Notes (Signed)
Woke up this morning with body aches and sinus congestion. The past 2 hours has had chills and more body aches. Has not checked temperature. Took tylenol cold and flu.

## 2023-06-28 NOTE — Discharge Instructions (Addendum)
 Advised patient may take OTC Tylenol  1 g every 6 hours for fever (oral temperature greater than 100.3.  Encouraged to increase daily water intake to 64 ounces per day 7 days/week.  Advised if symptoms worsen and/or unresolved please follow-up with PCP or here for further evaluation.

## 2023-08-13 ENCOUNTER — Other Ambulatory Visit: Payer: Self-pay

## 2023-08-13 ENCOUNTER — Encounter: Payer: Self-pay | Admitting: Emergency Medicine

## 2023-08-13 ENCOUNTER — Emergency Department

## 2023-08-13 DIAGNOSIS — H65191 Other acute nonsuppurative otitis media, right ear: Secondary | ICD-10-CM | POA: Diagnosis not present

## 2023-08-13 DIAGNOSIS — J069 Acute upper respiratory infection, unspecified: Secondary | ICD-10-CM | POA: Diagnosis not present

## 2023-08-13 DIAGNOSIS — R059 Cough, unspecified: Secondary | ICD-10-CM | POA: Diagnosis present

## 2023-08-13 LAB — RESP PANEL BY RT-PCR (RSV, FLU A&B, COVID)  RVPGX2
Influenza A by PCR: NEGATIVE
Influenza B by PCR: NEGATIVE
Resp Syncytial Virus by PCR: NEGATIVE
SARS Coronavirus 2 by RT PCR: NEGATIVE

## 2023-08-13 NOTE — ED Triage Notes (Signed)
 Pt arrived via POV with c/o cold sxs x 2 days, subjective fevers, and R ear pain that started today. Pt reports sudden sharp ear pain.  No distress noted.

## 2023-08-14 ENCOUNTER — Emergency Department
Admission: EM | Admit: 2023-08-14 | Discharge: 2023-08-14 | Disposition: A | Discharge status: Home / Self Care | Attending: Emergency Medicine | Admitting: Emergency Medicine

## 2023-08-14 DIAGNOSIS — H65191 Other acute nonsuppurative otitis media, right ear: Secondary | ICD-10-CM

## 2023-08-14 DIAGNOSIS — J069 Acute upper respiratory infection, unspecified: Secondary | ICD-10-CM

## 2023-08-14 MED ORDER — DEXAMETHASONE SOD PHOSPHATE PF 10 MG/ML IJ SOLN
10.0000 mg | Freq: Once | INTRAMUSCULAR | Status: AC
  Administered 2023-08-14: 10 mg via ORAL
  Filled 2023-08-14: qty 1

## 2023-08-14 NOTE — ED Provider Notes (Signed)
 [facility] Provider Note    Event Date/Time   First MD Initiated Contact with Patient 08/14/23 [phone]     (approximate)   History   Cough and Ear Pain   HPI Tony Holmes is a 33 y.o. male who presents for evaluation of about 3 days of respiratory viral symptoms including nasal congestion, cough, and most recently some ear pain in the right ear.  He feels like he has a lot of fluid in his head.  He is not having trouble breathing, just occasional cough.  He has been taking over-the-counter cold medicine and has been trying Flonase and cetirizine.  No fever, chest pain, nausea, vomiting, nor abdominal pain.  Although he had sharp pain in his right ear, he did not have any fluid come out of the ear and he tried cleaning it with Debrox but does not believe he has much earwax or that this is the result of an impaction.     Physical Exam   Triage Vital Signs: ED Triage Vitals  Encounter Vitals Group     BP 08/13/23 2230 (!) 146/85     Systolic BP Percentile --      Diastolic BP Percentile --      Pulse Rate 08/13/23 2230 96     Resp 08/13/23 2230 17     Temp 08/13/23 2230 98.7 F (37.1 C)     Temp Source 08/13/23 2230 Oral     SpO2 08/13/23 2230 99 %     Weight 08/13/23 2222 113.4 kg (250 lb)     Height 08/13/23 2222 1.829 m (6')     Head Circumference --      Peak Flow --      Pain Score 08/13/23 2222 8     Pain Loc --      Pain Education --      Exclude from Growth Chart --     Most recent vital signs: Vitals:   08/13/23 2230  BP: (!) 146/85  Pulse: 96  Resp: 17  Temp: 98.7 F (37.1 C)  SpO2: 99%    General: Awake, no distress.  Generally well-appearing. HEENT: Patient clearly has nasal congestion.  Ears are clear bilaterally and nonerythematous.  Tympanic membrane is dull on the right but not bulging and no evidence of purulence.  No tympanic membrane perforation.  No drainage.  CV:  Good peripheral perfusion.  Resp:  Normal effort.  Speaking easily and comfortably, no accessory muscle usage nor intercostal retractions.   Abd:  No distention.    ED Results / Procedures / Treatments   Labs (all labs ordered are listed, but only abnormal results are displayed) Labs Reviewed  RESP PANEL BY RT-PCR (RSV, FLU A&B, COVID)  [id number]      RADIOLOGY I viewed and interpreted the patient's two-view chest x-ray and I see no evidence of pneumonia.  I also read the radiologist's report, which confirmed no acute findings.   PROCEDURES:  Critical Care performed: No  Procedures    IMPRESSION / MDM / ASSESSMENT AND PLAN / ED COURSE  I reviewed the triage vital signs and the nursing notes.                              Differential diagnosis includes, but is not limited to, viral illness, otitis media, effusion, sinusitis, tympanic membrane perforation.  Patient's presentation is most consistent with acute complicated illness / injury requiring diagnostic workup.  Labs/studies ordered: Two-view chest x-ray, respiratory viral panel  Interventions/Medications given:  Medications  dexamethasone (DECADRON) 10 MG/ML injection for Pediatric ORAL use 10 mg (has no administration in time range)    (Note:  hospital course my include additional interventions and/or labs/studies not listed above.)   Vital signs essentially normal other than some mild hypertension.  Chest x-ray and respiratory viral panel negative.  Physical exam suggestive of viral illness, no evidence of bacterial otitis media.  I ordered a dose of Decadron to help with the inflammation which may help his ear clear.  Recommended over-the-counter medications and outpatient follow-up as needed.  The patient's medical screening exam is reassuring with no indication of an emergent medical condition requiring hospitalization or additional evaluation at this point.  The patient is safe and appropriate for discharge and outpatient follow up.         FINAL CLINICAL  IMPRESSION(S) / ED DIAGNOSES   Final diagnoses:  Viral URI with cough  Acute effusion of right ear     Rx / DC Orders   ED Discharge Orders     None        Note:  This document was prepared using Dragon voice recognition software and may include unintentional dictation errors.   Loleta Rose, MD 08/14/23 [phone]

## 2023-08-14 NOTE — Discharge Instructions (Signed)
 You have been seen in the Emergency Department (ED) today for a likely viral illness.  Please drink plenty of clear fluids (water, Gatorade, chicken broth, etc).  You may use Tylenol and/or Motrin according to label instructions.  You can alternate between the two without any side effects.  Continue using the Flonase nasal spray, over-the-counter cold medicine if it helps, and a daily cetirizine (Zyrtec), as these things may help with your symptoms.  We also gave you a one-time dose of medication called Decadron which may help with the inflammation in your sinuses and ears and help your ear drain the fluid.  It does not appear you need antibiotics at this time.  Please follow up with your doctor as listed above.  Call your doctor or return to the Emergency Department (ED) if you are unable to tolerate fluids due to vomiting, have worsening trouble breathing, become extremely tired or difficult to awaken, or if you develop any other symptoms that concern you.

## 2023-08-16 ENCOUNTER — Telehealth: Payer: Self-pay

## 2023-08-16 NOTE — Transitions of Care (Post Inpatient/ED Visit) (Signed)
   08/16/2023  Name: Tony Holmes MRN: [id number] DOB: 1990/12/04  Today's TOC FU Call Status: Today's TOC FU Call Status:: Unsuccessful Call (1st Attempt) Unsuccessful Call (1st Attempt) Date: 08/16/23  Attempted to reach the patient regarding the most recent Inpatient/ED visit.  Follow Up Plan: Additional outreach attempts will be made to reach the patient to complete the Transitions of Care (Post Inpatient/ED visit) call.   Signature  Jodelle Green, RMA

## 2023-08-17 NOTE — Transitions of Care (Post Inpatient/ED Visit) (Signed)
   08/17/2023  Name: Tony Holmes MRN: [id number] DOB: May 24, 1991  Today's TOC FU Call Status: Today's TOC FU Call Status:: Unsuccessful Call (2nd Attempt) Unsuccessful Call (1st Attempt) Date: 08/16/23 Unsuccessful Call (2nd Attempt) Date: 08/17/23  Attempted to reach the patient regarding the most recent Inpatient/ED visit.  Follow Up Plan: Additional outreach attempts will be made to reach the patient to complete the Transitions of Care (Post Inpatient/ED visit) call.   Signature Jodelle Green, RMA

## 2023-08-19 NOTE — Transitions of Care (Post Inpatient/ED Visit) (Signed)
   08/19/2023  Name: Tony Holmes MRN: [id number] DOB: Dec 09, 1990  Today's TOC FU Call Status: Today's TOC FU Call Status:: Unsuccessful Call (3rd Attempt) Unsuccessful Call (1st Attempt) Date: 08/16/23 Unsuccessful Call (2nd Attempt) Date: 08/17/23 Unsuccessful Call (3rd Attempt) Date: 08/19/23  Attempted to reach the patient regarding the most recent Inpatient/ED visit.  Follow Up Plan: No further outreach attempts will be made at this time. We have been unable to contact the patient.  Signature  Jodelle Green, RMA

## 2023-12-20 ENCOUNTER — Encounter: Admitting: Family Medicine

## 2024-02-19 ENCOUNTER — Telehealth: Admitting: Family

## 2024-02-19 DIAGNOSIS — J029 Acute pharyngitis, unspecified: Secondary | ICD-10-CM | POA: Diagnosis not present

## 2024-02-19 DIAGNOSIS — J069 Acute upper respiratory infection, unspecified: Secondary | ICD-10-CM

## 2024-02-19 MED ORDER — FLUTICASONE PROPIONATE 50 MCG/ACT NA SUSP: 2.0000 | Freq: Every day | NASAL | 6 refills | Status: AC

## 2024-02-19 MED ORDER — CETIRIZINE HCL 10 MG PO TABS: 10.0000 mg | ORAL_TABLET | Freq: Every day | ORAL | 1 refills | Status: DC

## 2024-02-19 NOTE — Progress Notes (Signed)
Approximately 5 minutes was spent documenting and reviewing patient's chart.

## 2024-02-19 NOTE — Progress Notes (Signed)

## 2024-03-20 ENCOUNTER — Encounter: Payer: Self-pay | Admitting: Family Medicine

## 2024-03-20 ENCOUNTER — Ambulatory Visit: Admitting: Family Medicine

## 2024-03-20 VITALS — BP 145/95 | HR 101 | Temp 98.6°F | Resp 18 | Wt 256.6 lb

## 2024-03-20 DIAGNOSIS — Z6834 Body mass index (BMI) 34.0-34.9, adult: Secondary | ICD-10-CM

## 2024-03-20 DIAGNOSIS — J069 Acute upper respiratory infection, unspecified: Secondary | ICD-10-CM

## 2024-03-20 DIAGNOSIS — E6609 Other obesity due to excess calories: Secondary | ICD-10-CM

## 2024-03-20 DIAGNOSIS — R7303 Prediabetes: Secondary | ICD-10-CM

## 2024-03-20 DIAGNOSIS — I10 Essential (primary) hypertension: Secondary | ICD-10-CM

## 2024-03-20 DIAGNOSIS — Z0001 Encounter for general adult medical examination with abnormal findings: Secondary | ICD-10-CM

## 2024-03-20 DIAGNOSIS — E782 Mixed hyperlipidemia: Secondary | ICD-10-CM

## 2024-03-20 DIAGNOSIS — Z0000 Encounter for general adult medical examination without abnormal findings: Secondary | ICD-10-CM | POA: Diagnosis not present

## 2024-03-20 DIAGNOSIS — E66811 Obesity, class 1: Secondary | ICD-10-CM | POA: Diagnosis not present

## 2024-03-20 DIAGNOSIS — J3489 Other specified disorders of nose and nasal sinuses: Secondary | ICD-10-CM | POA: Insufficient documentation

## 2024-03-20 LAB — POCT INFLUENZA A/B
Influenza A, POC: NEGATIVE
Influenza B, POC: NEGATIVE

## 2024-03-20 LAB — POC COVID19 BINAXNOW: SARS Coronavirus 2 Ag: NEGATIVE

## 2024-03-20 NOTE — Patient Instructions (Addendum)
  VISIT SUMMARY: Today, you had your annual physical exam. We discussed your sinus pressure, snoring, blood pressure, and cardiometabolic risk factors. We also planned a comprehensive assessment to evaluate your overall health.  YOUR PLAN: ACUTE VIRAL SINUSITIS: You have sinus pressure and drainage, primarily on the right side, which is likely due to a viral infection. -Use Flonase for nasal congestion. -Take ibuprofen 400 mg every 4-6 hours as needed for discomfort. -Use Mucinex DM for cough. -Try honey and a neti pot for symptom relief. -Avoid Sudafed as it can raise your blood pressure.  ELEVATED BLOOD PRESSURE: Your blood pressure was elevated today, possibly due to your current illness. -Monitor your blood pressure at home. -Recheck your blood pressure in one month. -We will consider treatment if your blood pressure remains high.  POSSIBLE OBSTRUCTIVE SLEEP APNEA: You have been experiencing snoring and daytime fatigue, which may be related to sleep apnea. -We recommend a home sleep study to check for obstructive sleep apnea.  PREDIABETES, OBESITY, AND MIXED HYPERLIPIDEMIA: You have prediabetes, obesity, and mixed hyperlipidemia, which we are managing with lifestyle changes. -Continue with your current diet and exercise plan. -We will conduct a cardiometabolic risk assessment to evaluate your health.  GENERAL HEALTH MAINTENANCE: We are conducting a comprehensive assessment to evaluate your overall health. -We ordered several tests including hemoglobin A1c, C-peptide, random insulin, apolipoprotein B, apolipoprotein A, fasting LDL, HDL, total cholesterol, triglycerides, thyroid stimulating hormone with Reflex TSH, estimated GFR, urine microalbumin creatinine ratio, complete metabolic panel with CBC, FIP4, and an insulin resistance score.

## 2024-03-20 NOTE — Progress Notes (Signed)
 Assessment  Assessment/Plan:  Assessment and Plan Assessment & Plan Adult Wellness Visit Annual physical examination conducted with focus on lifestyle management and cardiometabolic risk assessment. - Recommend annual physical examination - Order comprehensive cardiometabolic disease assessment including hemoglobin A1c, C-peptide, random insulin, apolipoprotein B, apolipoprotein A, fasting LDL, HDL, total cholesterol, triglycerides - Order thyroid stimulating hormone with Reflex TSH - Order estimated GFR and urine microalbumin creatinine ratio - Order complete metabolic panel with CBC - Calculate FIP4 - Order insulin resistance score  Acute viral sinusitis, right-sided predominant Acute onset of sinus pressure, rhinorrhea, and drainage predominantly on the right side. Symptoms suggestive of viral sinusitis. COVID and flu tests negative. No fever or severe pain reported. - Recommend Flonase for nasal congestion - Advise use of ibuprofen 400 mg every 4-6 hours as needed for discomfort - Recommend Mucinex DM for cough - Advise use of honey and neti pot for symptomatic relief - Avoid Sudafed due to potential to elevate blood pressure  Elevated blood pressure, possible hypertension Intermittent elevated blood pressure, possibly secondary to acute illness. Blood pressure was 145/95 during the visit. Previous readings were around 140/83-84. Consideration of hypertension diagnosis if elevated readings persist after recovery from current illness. - Monitor blood pressure at home - Recheck blood pressure in one month - Consider management of hypertension if elevated readings persist  Possible obstructive sleep apnea Reports of snoring and daytime fatigue, potentially exacerbated by current illness. New onset of elevated blood pressure raises suspicion for obstructive sleep apnea. - Recommend home sleep study to evaluate for obstructive sleep apnea  Prediabetes, obesity, and mixed  hyperlipidemia with cardiometabolic risk assessment Prediabetes, obesity (BMI 34.8), and mixed hyperlipidemia managed with lifestyle modifications. Cardiometabolic risk assessment planned to evaluate glycemic control and cardiovascular risk. - Continue lifestyle modifications including diet and exercise - Order cardiometabolic risk assessment as part of the annual wellness visit     There are no discontinued medications.  Patient Counseling(The following topics were reviewed and/or handout was given):  -Nutrition: Stressed importance of moderation in sodium/caffeine intake, saturated fat and cholesterol, caloric balance, sufficient intake of fresh fruits, vegetables, and fiber.  -Stressed the importance of regular exercise.   -Substance Abuse: Discussed cessation/primary prevention of tobacco, alcohol, or other drug use; driving or other dangerous activities under the influence; availability of treatment for abuse.   -Injury prevention: Discussed safety belts, safety helmets, smoke detector, smoking near bedding or upholstery.   -Sexuality: Discussed sexually transmitted diseases, partner selection, use of condoms, avoidance of unintended pregnancy and contraceptive alternatives.   -Dental health: Discussed importance of regular tooth brushing, flossing, and dental visits.  -Health maintenance and immunizations reviewed. Please refer to Health maintenance section.  Return in about 1 month (around 04/20/2024) for BP.        Subjective:   Encounter date: 03/20/2024  Chief Complaint  Patient presents with   URI    Pt c/o of sinus pressure for 1 day with nasal congestion.   HM due- immunizations (Pt declined)    Discussed the use of AI scribe software for clinical note transcription with the patient, who gave verbal consent to proceed.  History of Present Illness Tony Holmes is a 33 year old male who presents for an annual physical exam.  Sinus pressure and upper respiratory  symptoms - Sinus pressure since yesterday, primarily on the right side - Pressure located behind the eyes, in the front of the forehead, and extending slightly down the back of the neck - Associated symptoms include  runny nose and drainage - No chest pain, shortness of breath, or fever - No medications taken for discomfort; intended to take Tylenol  Cold Sinus but ran out - COVID and flu tests negative - Flonase available at home but not used for current symptoms  Snoring and sleep disturbance - Snoring observed by his wife - Increased daytime fatigue - Attributes fatigue to nighttime awakenings with his seven-month-old child - Snoring worsens during upper respiratory illness  Cardiometabolic risk factors - Obesity with BMI of 34.8 - Hyperlipidemia managed with lifestyle modifications including diet and exercise - Prediabetes managed with lifestyle modifications including diet and exercise - No recent home blood pressure measurements; last recalled value was 140/83 or 84 - Not fasting today       03/20/2024    2:30 PM 12/09/2022    8:41 AM 11/11/2022   10:58 AM  Depression screen PHQ 2/9  Decreased Interest 0 0 0  Down, Depressed, Hopeless 0 0 0  PHQ - 2 Score 0 0 0  Altered sleeping 0 0 1  Tired, decreased energy 0 0 0  Change in appetite 0 0 0  Feeling bad or failure about yourself  0 0 0  Trouble concentrating 0 0 0  Moving slowly or fidgety/restless 0 0 0  Suicidal thoughts 0 0 0  PHQ-9 Score 0 0 1  Difficult doing work/chores Not difficult at all Not difficult at all Not difficult at all       03/20/2024    2:31 PM 12/09/2022    8:42 AM 11/11/2022   10:58 AM  GAD 7 : Generalized Anxiety Score  Nervous, Anxious, on Edge 0 0 0  Control/stop worrying 0 0 0  Worry too much - different things 0 0 0  Trouble relaxing 0 0 0  Restless 0 0 0  Easily annoyed or irritable 0 0 0  Afraid - awful might happen 0 0 0  Total GAD 7 Score 0 0 0  Anxiety Difficulty Not difficult  at all Not difficult at all Not difficult at all    There are no preventive care reminders to display for this patient.   PMH:  The following were reviewed and entered/updated in epic: Past Medical History:  Diagnosis Date   Chronic kidney disease    Encounter for well adult exam with abnormal findings 12/09/2022   Moderate mixed hyperlipidemia not requiring statin therapy 12/09/2022   Primary hypertension 03/20/2024    Patient Active Problem List   Diagnosis Date Noted   Sinus pressure 03/20/2024   Primary hypertension 03/20/2024   Prediabetes 03/20/2024   Class 1 obesity without serious comorbidity in adult 12/09/2022   Hearing loss of right ear due to cerumen impaction 12/09/2022   Moderate mixed hyperlipidemia not requiring statin therapy 12/09/2022   Encounter for well adult exam with abnormal findings 12/09/2022   Exposure to toxic chemical 11/11/2022   Chronic low back pain 11/11/2022    History reviewed. No pertinent surgical history.  Family History  Problem Relation Age of Onset   Diabetes Sister     Medications- reviewed and updated Outpatient Medications Prior to Visit  Medication Sig Dispense Refill   fluticasone (FLONASE) 50 MCG/ACT nasal spray Place 2 sprays into both nostrils daily. 16 g 6   cetirizine (ZYRTEC ALLERGY) 10 MG tablet Take 1 tablet (10 mg total) by mouth daily. 90 tablet 1   No facility-administered medications prior to visit.    Allergies  Allergen Reactions   Pollen Extract Other (See  Comments)    Runny nose    Social History   Socioeconomic History   Marital status: Single    Spouse name: Not on file   Number of children: Not on file   Years of education: Not on file   Highest education level: Bachelor's degree (e.g., BA, AB, BS)  Occupational History   Not on file  Tobacco Use   Smoking status: Never    Passive exposure: Never   Smokeless tobacco: Never  Vaping Use   Vaping status: Never Used  Substance and Sexual  Activity   Alcohol use: Yes    Alcohol/week: 1.0 standard drink of alcohol    Types: 1 Cans of beer per week    Comment: 1 drink of month   Drug use: No   Sexual activity: Yes    Birth control/protection: None  Other Topics Concern   Not on file  Social History Narrative   Not on file   Social Drivers of Health   Financial Resource Strain: Low Risk  (03/19/2024)   Overall Financial Resource Strain (CARDIA)    Difficulty of Paying Living Expenses: Not hard at all  Food Insecurity: No Food Insecurity (03/19/2024)   Hunger Vital Sign    Worried About Running Out of Food in the Last Year: Never true    Ran Out of Food in the Last Year: Never true  Transportation Needs: No Transportation Needs (03/19/2024)   PRAPARE - Administrator, Civil Service (Medical): No    Lack of Transportation (Non-Medical): No  Physical Activity: Insufficiently Active (03/19/2024)   Exercise Vital Sign    Days of Exercise per Week: 1 day    Minutes of Exercise per Session: 30 min  Stress: Stress Concern Present (03/19/2024)   [employer] of Occupational Health - Occupational Stress Questionnaire    Feeling of Stress: To some extent  Social Connections: Socially Isolated (03/19/2024)   Social Connection and Isolation Panel    Frequency of Communication with Friends and Family: Once a week    Frequency of Social Gatherings with Friends and Family: Never    Attends Religious Services: Never    Diplomatic Services Operational Officer: No    Attends Engineer, Structural: Not on file    Marital Status: Married           Objective:  Physical Exam: BP (!) 145/95 (BP Location: Left Arm, Patient Position: Sitting, Cuff Size: Large)   Pulse (!) 101   Temp 98.6 F (37 C) (Oral)   Resp 18   Wt 256 lb 9.6 oz (116.4 kg)   SpO2 95%   BMI 34.80 kg/m   Body mass index is 34.8 kg/m. Wt Readings from Last 3 Encounters:  03/20/24 256 lb 9.6 oz (116.4 kg)  08/13/23 250 lb (113.4  kg)  12/09/22 245 lb 3.2 oz (111.2 kg)    Physical Exam VITALS: P- 101, BP- 145/95 MEASUREMENTS: BMI- 34.8. GENERAL: Alert, cooperative, well developed, no acute distress. HEENT: Normocephalic, normal oropharynx, moist mucous membranes, no sinus tenderness on palpation, cerumen present in right ear canal, left ear canal clear. CHEST: Clear to auscultation bilaterally, no wheezes, rhonchi, or crackles. CARDIOVASCULAR: Normal heart rate and rhythm, S1 and S2 normal without murmurs. ABDOMEN: Soft, non-tender, non-distended, without organomegaly, normal bowel sounds. EXTREMITIES: No cyanosis or edema. NEUROLOGICAL: Cranial nerves grossly intact, moves all extremities without gross motor or sensory deficit.  Physical Exam      Prior labs:   Recent Results (  from the past 2160 hours)  POC COVID-19 BinaxNow     Status: Normal   Collection Time: 03/20/24  2:50 PM  Result Value Ref Range   SARS Coronavirus 2 Ag Negative Negative  POCT Influenza A/B     Status: Normal   Collection Time: 03/20/24  2:51 PM  Result Value Ref Range   Influenza A, POC Negative Negative   Influenza B, POC Negative Negative    Lab Results  Component Value Date   CHOL 237 (H) 12/09/2022   Lab Results  Component Value Date   HDL 52.30 12/09/2022   Lab Results  Component Value Date   LDLCALC 167 (H) 12/09/2022   Lab Results  Component Value Date   TRIG 92.0 12/09/2022   Lab Results  Component Value Date   CHOLHDL 5 12/09/2022   No results found for: LDLDIRECT  Last metabolic panel Lab Results  Component Value Date   GLUCOSE 86 12/09/2022   NA 136 12/09/2022   K 4.2 12/09/2022   CL 101 12/09/2022   CO2 28 12/09/2022   BUN 18 12/09/2022   CREATININE 1.03 12/09/2022   GFR 96.13 12/09/2022   CALCIUM 9.9 12/09/2022   PROT 7.4 12/09/2022   ALBUMIN 4.6 12/09/2022   BILITOT 1.0 12/09/2022   ALKPHOS 59 12/09/2022   AST 27 12/09/2022   ALT 39 12/09/2022    Lab Results  Component Value  Date   HGBA1C 6.2 12/09/2022    Last CBC Lab Results  Component Value Date   WBC 3.6 (L) 12/09/2022   HGB 14.2 12/09/2022   HCT 44.0 12/09/2022   MCV 81.5 12/09/2022   RDW 15.1 12/09/2022   PLT 240.0 12/09/2022    Lab Results  Component Value Date   TSH 1.08 12/09/2022    No results found for: PSA1, PSA  Last vitamin D  No results found for: MARIEN BOLLS, VD25OH  Lab Results  Component Value Date   BILIRUBINUR NEGATIVE 12/09/2022   UROBILINOGEN 0.2 12/09/2022   LEUKOCYTESUR NEGATIVE 12/09/2022    No results found for: LABMICR, MICROALBUR   At today's visit, we discussed treatment options, associated risk and benefits, and engage in counseling as needed.  Additionally the following were reviewed: Past medical records, past medical and surgical history, family and social background, as well as relevant laboratory results, imaging findings, and specialty notes, where applicable.  This message was generated using dictation software, and as a result, it may contain unintentional typos or errors.  Nevertheless, extensive effort was made to accurately convey at the pertinent aspects of the patient visit.    There may have been are other unrelated non-urgent complaints, but due to the busy schedule and the amount of time already spent with him, time does not permit to address these issues at today's visit. Another appointment may have or has been requested to review these additional issues.     Arvella Hummer, MD, MS

## 2024-03-21 ENCOUNTER — Encounter: Payer: Self-pay | Admitting: Family Medicine

## 2024-03-21 DIAGNOSIS — Z9189 Other specified personal risk factors, not elsewhere classified: Secondary | ICD-10-CM

## 2024-04-03 ENCOUNTER — Ambulatory Visit (INDEPENDENT_AMBULATORY_CARE_PROVIDER_SITE_OTHER): Admitting: Sleep Medicine

## 2024-04-03 ENCOUNTER — Encounter: Payer: Self-pay | Admitting: Sleep Medicine

## 2024-04-03 VITALS — BP 122/72 | HR 78 | Temp 98.5°F | Ht 72.0 in | Wt 252.4 lb

## 2024-04-03 DIAGNOSIS — G4733 Obstructive sleep apnea (adult) (pediatric): Secondary | ICD-10-CM | POA: Diagnosis not present

## 2024-04-03 DIAGNOSIS — E669 Obesity, unspecified: Secondary | ICD-10-CM

## 2024-04-03 NOTE — Progress Notes (Signed)
 Name:Tony Holmes MRN: [id number] DOB: 02/11/1991   CHIEF COMPLAINT:  EXCESSIVE DAYTIME SLEEPINESS   HISTORY OF PRESENT ILLNESS: Tony Holmes is a 33 y.o. w/ a h/o allergic rhinitis and obesity who presents for c/o loud snoring and excessive daytime sleepiness which has been present for several years. Reports having two young children which still awaken during the night. Reports nocturnal awakenings due to unclear reasons and has difficulty falling back to sleep. Denies any significant weight changes. Admits to dry mouth and night sweats. Denies morning headaches, RLS symptoms, dream enactment, cataplexy, hypnagogic or hypnapompic hallucinations. Denies a family history of sleep apnea. Denies drowsy driving. Drinks 1 energy drink daily, occasional alcohol use, denies tobacco or illicit drug use.   Bedtime 10-11 pm Sleep onset 20 mins Rise time 6-6:45 am   EPWORTH SLEEP SCORE 6    04/03/2024   11:04 AM  Results of the Epworth flowsheet  Sitting and reading 1  Watching TV 1  Sitting, inactive in a public place (e.g. a theatre or a meeting) 0  As a passenger in a car for an hour without a break 1  Lying down to rest in the afternoon when circumstances permit 2  Sitting and talking to someone 0  Sitting quietly after a lunch without alcohol 1  In a car, while stopped for a few minutes in traffic 0  Total score 6    PAST MEDICAL HISTORY :   has a past medical history of Chronic kidney disease, Encounter for well adult exam with abnormal findings (12/09/2022), Moderate mixed hyperlipidemia not requiring statin therapy (12/09/2022), and Primary hypertension (03/20/2024).  has no past surgical history on file. Prior to Admission medications   Medication Sig Start Date End Date Taking? Authorizing Provider  fluticasone (FLONASE) 50 MCG/ACT nasal spray Place 2 sprays into both nostrils daily. 02/19/24  Yes Tony Holmes LABOR, FNP   Allergies  Allergen Reactions   Pollen Extract  Other (See Comments)    Runny nose    FAMILY HISTORY:  family history includes Diabetes in his sister. SOCIAL HISTORY:  reports that he has never smoked. He has never been exposed to tobacco smoke. He has never used smokeless tobacco. He reports current alcohol use of about 1.0 standard drink of alcohol per week. He reports that he does not use drugs.   Review of Systems:  Gen:  Denies  fever, sweats, chills weight loss  HEENT: Denies blurred vision, double vision, ear pain, eye pain, hearing loss, nose bleeds, sore throat Cardiac:  No dizziness, chest pain or heaviness, chest tightness,edema, No JVD Resp:   No cough, -sputum production, -shortness of breath,-wheezing, -hemoptysis,  Gi: Denies swallowing difficulty, stomach pain, nausea or vomiting, diarrhea, constipation, bowel incontinence Gu:  Denies bladder incontinence, burning urine Ext:   Denies Joint pain, stiffness or swelling Skin: Denies  skin rash, easy bruising or bleeding or hives Endoc:  Denies polyuria, polydipsia , polyphagia or weight change Psych:   Denies depression, insomnia or hallucinations  Other:  All other systems negative  VITAL SIGNS: BP 122/72   Pulse 78   Temp 98.5 F (36.9 C)   Ht 6' (1.829 m)   Wt 252 lb 6.4 oz (114.5 kg)   SpO2 98%   BMI 34.23 kg/m    Physical Examination:   General Appearance: No distress  EYES PERRLA, EOM intact.   NECK Supple, No JVD Pulmonary: normal breath sounds, No wheezing.  CardiovascularNormal S1,S2.  No m/r/g.  Abdomen: Benign, Soft, non-tender. Skin:   warm, no rashes, no ecchymosis  Extremities: normal, no cyanosis, clubbing. Neuro:without focal findings,  speech normal  PSYCHIATRIC: Mood, affect within normal limits.   ASSESSMENT AND PLAN  OSA I suspect that OSA is likely present due to clinical presentation. Discussed the consequences of untreated sleep apnea. Advised not to drive drowsy for safety of patient and others. Will complete further  evaluation with a home sleep study and follow up to review results.    Obesity Counseled patient on diet and lifestyle modification.    MEDICATION ADJUSTMENTS/LABS AND TESTS ORDERED: Recommend Sleep Study   Patient  satisfied with Plan of action and management. All questions answered  Follow up to review HST results and treatment plan.   I spent a total of 45 minutes reviewing chart data, face-to-face evaluation with the patient, counseling and coordination of care as detailed above.    Tony Holmes, M.D.  Sleep Medicine [facility]

## 2024-04-03 NOTE — Patient Instructions (Signed)
 Tony Holmes

## 2024-04-18 ENCOUNTER — Encounter

## 2024-04-18 DIAGNOSIS — G4733 Obstructive sleep apnea (adult) (pediatric): Secondary | ICD-10-CM

## 2024-04-23 ENCOUNTER — Other Ambulatory Visit (INDEPENDENT_AMBULATORY_CARE_PROVIDER_SITE_OTHER)

## 2024-04-23 DIAGNOSIS — E6609 Other obesity due to excess calories: Secondary | ICD-10-CM | POA: Diagnosis not present

## 2024-04-23 DIAGNOSIS — E66811 Obesity, class 1: Secondary | ICD-10-CM

## 2024-04-23 DIAGNOSIS — Z6834 Body mass index (BMI) 34.0-34.9, adult: Secondary | ICD-10-CM

## 2024-04-23 DIAGNOSIS — E782 Mixed hyperlipidemia: Secondary | ICD-10-CM

## 2024-04-23 DIAGNOSIS — I10 Essential (primary) hypertension: Secondary | ICD-10-CM

## 2024-04-23 DIAGNOSIS — R7303 Prediabetes: Secondary | ICD-10-CM | POA: Diagnosis not present

## 2024-04-23 DIAGNOSIS — Z0001 Encounter for general adult medical examination with abnormal findings: Secondary | ICD-10-CM

## 2024-04-23 LAB — MICROALBUMIN / CREATININE URINE RATIO
Creatinine,U: 205.2 mg/dL
Microalb Creat Ratio: 3.6 mg/g (ref 0.0–30.0)
Microalb, Ur: 0.7 mg/dL (ref 0.0–1.9)

## 2024-04-23 LAB — COMPREHENSIVE METABOLIC PANEL WITH GFR
ALT: 49 U/L (ref 0–53)
AST: 31 U/L (ref 0–37)
Albumin: 4.6 g/dL (ref 3.5–5.2)
Alkaline Phosphatase: 51 U/L (ref 39–117)
BUN: 14 mg/dL (ref 6–23)
CO2: 31 meq/L (ref 19–32)
Calcium: 10.1 mg/dL (ref 8.4–10.5)
Chloride: 102 meq/L (ref 96–112)
Creatinine, Ser: 1.13 mg/dL (ref 0.40–1.50)
GFR: 85.19 mL/min (ref >60.00)
Glucose, Bld: 85 mg/dL (ref 70–99)
Potassium: 4 meq/L (ref 3.5–5.1)
Sodium: 140 meq/L (ref 135–145)
Total Bilirubin: 0.6 mg/dL (ref 0.2–1.2)
Total Protein: 7.7 g/dL (ref 6.0–8.3)

## 2024-04-23 LAB — CBC WITH DIFFERENTIAL/PLATELET
Basophils Absolute: 0 K/uL (ref 0.0–0.1)
Basophils Relative: 1.1 % (ref 0.0–3.0)
Eosinophils Absolute: 0 K/uL (ref 0.0–0.7)
Eosinophils Relative: 0.6 % (ref 0.0–5.0)
HCT: 43.8 % (ref 39.0–52.0)
Hemoglobin: 14.3 g/dL (ref 13.0–17.0)
Lymphocytes Relative: 35.7 % (ref 12.0–46.0)
Lymphs Abs: 1.6 K/uL (ref 0.7–4.0)
MCHC: 32.7 g/dL (ref 30.0–36.0)
MCV: 81 fl (ref 78.0–100.0)
Monocytes Absolute: 0.5 K/uL (ref 0.1–1.0)
Monocytes Relative: 11.4 % (ref 3.0–12.0)
Neutro Abs: 2.3 K/uL (ref 1.4–7.7)
Neutrophils Relative %: 51.2 % (ref 43.0–77.0)
Platelets: 245 K/uL (ref 150.0–400.0)
RBC: 5.4 Mil/uL (ref 4.22–5.81)
RDW: 14.6 % (ref 11.5–15.5)
WBC: 4.5 K/uL (ref 4.0–10.5)

## 2024-04-23 LAB — LIPID PANEL
Cholesterol: 208 mg/dL — ABNORMAL HIGH (ref 0–200)
HDL: 47.3 mg/dL (ref >39.00)
LDL Cholesterol: 140 mg/dL — ABNORMAL HIGH (ref 0–99)
NonHDL: 160.85
Total CHOL/HDL Ratio: 4
Triglycerides: 103 mg/dL (ref 0.0–149.0)
VLDL: 20.6 mg/dL (ref 0.0–40.0)

## 2024-04-23 LAB — HEMOGLOBIN A1C: Hgb A1c MFr Bld: 6 % (ref 4.6–6.5)

## 2024-04-23 LAB — MAGNESIUM: Magnesium: 2 mg/dL (ref 1.5–2.5)

## 2024-04-23 LAB — TESTOSTERONE: Testosterone: 375.97 ng/dL (ref 300.00–890.00)

## 2024-04-23 LAB — HIGH SENSITIVITY CRP: CRP, High Sensitivity: 3.27 mg/L (ref 0.000–5.000)

## 2024-04-24 ENCOUNTER — Encounter: Admitting: Family Medicine

## 2024-04-24 LAB — TSH RFX ON ABNORMAL TO FREE T4: TSH: 1.51 u[IU]/mL (ref 0.450–4.500)

## 2024-04-25 LAB — URINALYSIS W MICROSCOPIC + REFLEX CULTURE
Bacteria, UA: NONE SEEN /HPF
Bilirubin Urine: NEGATIVE
Glucose, UA: NEGATIVE
Hgb urine dipstick: NEGATIVE
Hyaline Cast: NONE SEEN /LPF
Ketones, ur: NEGATIVE
Leukocyte Esterase: NEGATIVE
Nitrites, Initial: NEGATIVE
Protein, ur: NEGATIVE
RBC / HPF: NONE SEEN /HPF (ref 0–2)
Specific Gravity, Urine: 1.022 (ref 1.001–1.035)
Squamous Epithelial / HPF: NONE SEEN /HPF (ref <=5)
WBC, UA: NONE SEEN /HPF (ref 0–5)
pH: 7.5 (ref 5.0–8.0)

## 2024-04-25 LAB — INSULIN, RANDOM: Insulin: 15.7 u[IU]/mL

## 2024-04-25 LAB — C-PEPTIDE: C-Peptide: 1.82 ng/mL (ref 0.80–3.85)

## 2024-04-25 LAB — NO CULTURE INDICATED

## 2024-04-27 ENCOUNTER — Ambulatory Visit: Admitting: Family Medicine

## 2024-04-27 VITALS — BP 137/84 | HR 74 | Temp 98.1°F | Ht 72.0 in | Wt 255.4 lb

## 2024-04-27 DIAGNOSIS — I10 Essential (primary) hypertension: Secondary | ICD-10-CM | POA: Diagnosis not present

## 2024-04-27 DIAGNOSIS — E66811 Obesity, class 1: Secondary | ICD-10-CM | POA: Diagnosis not present

## 2024-04-27 DIAGNOSIS — R7303 Prediabetes: Secondary | ICD-10-CM | POA: Diagnosis not present

## 2024-04-27 DIAGNOSIS — E6609 Other obesity due to excess calories: Secondary | ICD-10-CM

## 2024-04-27 DIAGNOSIS — Z6834 Body mass index (BMI) 34.0-34.9, adult: Secondary | ICD-10-CM

## 2024-04-27 DIAGNOSIS — E782 Mixed hyperlipidemia: Secondary | ICD-10-CM

## 2024-04-27 DIAGNOSIS — R29818 Other symptoms and signs involving the nervous system: Secondary | ICD-10-CM

## 2024-04-27 NOTE — Progress Notes (Signed)
 Assessment & Plan   Assessment/Plan:    Problem List Items Addressed This Visit   None   Assessment and Plan Assessment & Plan Prediabetes A1c is 6, indicating prediabetes. Condition is slightly improved compared to last year. Discussed potential treatment with metformin, but he prefers to focus on lifestyle modifications. - Continue to monitor A1c levels - Encouraged dietary modifications and increased physical activity - Follow up in 6 months  Hyperlipidemia Cholesterol levels have improved but remain slightly elevated. Blood pressure is better controlled, which may positively impact lipid management. - Continue monitoring cholesterol levels - Encouraged healthy diet and regular exercise  Hypertension Blood pressure is better controlled compared to previous visits. No immediate need for medication adjustment. - Continue monitoring blood pressure - Encouraged lifestyle modifications including diet and exercise  Sleep study pending (possible sleep disorder) Sleep study completed but results are pending. He reports discomfort during the study but was able to sleep. - Await sleep study results  General Health Maintenance Overall health is well-managed with normal blood counts, liver function, and kidney function. Testosterone  levels are normal. No significant inflammation noted. - Continue routine health maintenance - Encouraged healthy lifestyle choices including diet and exercise       There are no discontinued medications.          Subjective:   Encounter date: 04/27/2024  Tony Holmes is a 33 y.o. male who has Exposure to toxic chemical; Chronic low back pain; Class 1 obesity without serious comorbidity in adult; Hearing loss of right ear due to cerumen impaction; Moderate mixed hyperlipidemia not requiring statin therapy; Encounter for well adult exam with abnormal findings; Sinus pressure; Primary hypertension; and Prediabetes on their problem list..   He   has a past medical history of Chronic kidney disease, Encounter for well adult exam with abnormal findings (12/09/2022), Moderate mixed hyperlipidemia not requiring statin therapy (12/09/2022), and Primary hypertension (03/20/2024).SABRA   He presents with chief complaint of Medical Management of Chronic Issues (1 mon f/u for BP, hasn't checked BP at home. Not fasting. ) .  Discussed the use of AI scribe software for clinical note transcription with the patient, who gave verbal consent to proceed.  History of Present Illness Tony Holmes is a 33 year old male who presents for follow-up on his blood pressure and recent sleep study.  Hypertension and cardiometabolic risk - Monitoring blood pressure at home. - Recent blood work shows slightly elevated cholesterol, improved compared to last year. - Hemoglobin A1c is 6, consistent with prediabetes, improved from last year. - Metabolic panel, liver function, kidney function, testosterone  levels, and blood counts are all within normal limits. - Challenges maintaining consistent diet and exercise, especially during the holiday season and with a nine-month-old child at home.  Sleep disturbance and sleep study - Recently completed a sleep study, which was uncomfortable but he was able to sleep during the procedure. - Awaiting results of the sleep study.    ROS  History reviewed. No pertinent surgical history.  Current Outpatient Medications on File Prior to Visit  Medication Sig Dispense Refill   fluticasone  (FLONASE ) 50 MCG/ACT nasal spray Place 2 sprays into both nostrils daily. 16 g 6   No current facility-administered medications on file prior to visit.    Family History  Problem Relation Age of Onset   Diabetes Sister     Social History   Socioeconomic History   Marital status: Single    Spouse name: Not on file   Number of children: Not  on file   Years of education: Not on file   Highest education level: Bachelor's degree (e.g.,  BA, AB, BS)  Occupational History   Not on file  Tobacco Use   Smoking status: Never    Passive exposure: Never   Smokeless tobacco: Never  Vaping Use   Vaping status: Never Used  Substance and Sexual Activity   Alcohol use: Yes    Alcohol/week: 1.0 standard drink of alcohol    Types: 1 Cans of beer per week    Comment: 1 drink of month   Drug use: No   Sexual activity: Yes    Birth control/protection: None  Other Topics Concern   Not on file  Social History Narrative   Not on file   Social Drivers of Health   Financial Resource Strain: Low Risk  (03/19/2024)   Overall Financial Resource Strain (CARDIA)    Difficulty of Paying Living Expenses: Not hard at all  Food Insecurity: No Food Insecurity (03/19/2024)   Hunger Vital Sign    Worried About Running Out of Food in the Last Year: Never true    Ran Out of Food in the Last Year: Never true  Transportation Needs: No Transportation Needs (03/19/2024)   PRAPARE - Administrator, Civil Service (Medical): No    Lack of Transportation (Non-Medical): No  Physical Activity: Insufficiently Active (03/19/2024)   Exercise Vital Sign    Days of Exercise per Week: 1 day    Minutes of Exercise per Session: 30 min  Stress: Stress Concern Present (03/19/2024)   [employer] of Occupational Health - Occupational Stress Questionnaire    Feeling of Stress: To some extent  Social Connections: Socially Isolated (03/19/2024)   Social Connection and Isolation Panel    Frequency of Communication with Friends and Family: Once a week    Frequency of Social Gatherings with Friends and Family: Never    Attends Religious Services: Never    Database Administrator or Organizations: No    Attends Engineer, Structural: Not on file    Marital Status: Married  Intimate Partner Violence: Unknown (08/28/2021)   Received from [facility]   HITS    Physically Hurt: Not on file    Insult or Talk Down To: Not on file     Threaten Physical Harm: Not on file    Scream or Curse: Not on file                                                                                                  Objective:  Physical Exam: BP 137/84   Pulse 74   Temp 98.1 F (36.7 C)   Ht 6' (1.829 m)   Wt 255 lb 6.4 oz (115.8 kg)   SpO2 98%   BMI 34.64 kg/m    Physical Exam Constitutional:      Appearance: Normal appearance.  HENT:     Head: Normocephalic and atraumatic.     Right Ear: Hearing normal.     Left Ear: Hearing normal.     Nose: Nose normal.  Eyes:  General: No scleral icterus.       Right eye: No discharge.        Left eye: No discharge.     Extraocular Movements: Extraocular movements intact.  Cardiovascular:     Rate and Rhythm: Normal rate and regular rhythm.     Heart sounds: Normal heart sounds.  Pulmonary:     Effort: Pulmonary effort is normal.     Breath sounds: Normal breath sounds.  Abdominal:     Palpations: Abdomen is soft.     Tenderness: There is no abdominal tenderness.  Skin:    General: Skin is warm.     Findings: No rash.  Neurological:     General: No focal deficit present.     Mental Status: He is alert.     Cranial Nerves: No cranial nerve deficit.  Psychiatric:        Mood and Affect: Mood normal.        Behavior: Behavior normal.        Thought Content: Thought content normal.        Judgment: Judgment normal.     No results found.  Recent Results (from the past 2160 hours)  POC COVID-19 BinaxNow     Status: Normal   Collection Time: 03/20/24  2:50 PM  Result Value Ref Range   SARS Coronavirus 2 Ag Negative Negative  POCT Influenza A/B     Status: Normal   Collection Time: 03/20/24  2:51 PM  Result Value Ref Range   Influenza A, POC Negative Negative   Influenza B, POC Negative Negative  CBC with Differential/Platelet     Status: None   Collection Time: 04/23/24  8:37 AM  Result Value Ref Range   WBC 4.5 4.0 - 10.5 K/uL   RBC 5.40 4.22 - 5.81 Mil/uL    Hemoglobin 14.3 13.0 - 17.0 g/dL   HCT 56.1 60.9 - 47.9 %   MCV 81.0 78.0 - 100.0 fl   MCHC 32.7 30.0 - 36.0 g/dL   RDW 85.3 88.4 - 84.4 %   Platelets 245.0 150.0 - 400.0 K/uL   Neutrophils Relative % 51.2 43.0 - 77.0 %   Lymphocytes Relative 35.7 12.0 - 46.0 %   Monocytes Relative 11.4 3.0 - 12.0 %   Eosinophils Relative 0.6 0.0 - 5.0 %   Basophils Relative 1.1 0.0 - 3.0 %   Neutro Abs 2.3 1.4 - 7.7 K/uL   Lymphs Abs 1.6 0.7 - 4.0 K/uL   Monocytes Absolute 0.5 0.1 - 1.0 K/uL   Eosinophils Absolute 0.0 0.0 - 0.7 K/uL   Basophils Absolute 0.0 0.0 - 0.1 K/uL  Urinalysis w microscopic + reflex cultur     Status: None   Collection Time: 04/23/24  8:37 AM   Specimen: Serum  Result Value Ref Range   Color, Urine YELLOW YELLOW   APPearance CLEAR CLEAR   Specific Gravity, Urine 1.022 1.001 - 1.035   pH 7.5 5.0 - 8.0   Glucose, UA NEGATIVE NEGATIVE   Bilirubin Urine NEGATIVE NEGATIVE   Ketones, ur NEGATIVE NEGATIVE   Hgb urine dipstick NEGATIVE NEGATIVE   Protein, ur NEGATIVE NEGATIVE   Nitrites, Initial NEGATIVE NEGATIVE   Leukocyte Esterase NEGATIVE NEGATIVE   WBC, UA NONE SEEN 0 - 5 /HPF   RBC / HPF NONE SEEN 0 - 2 /HPF   Squamous Epithelial / HPF NONE SEEN < OR = 5 /HPF   Bacteria, UA NONE SEEN NONE SEEN /HPF   Hyaline Cast NONE SEEN  NONE SEEN /LPF   Note      Comment: This urine was analyzed for the presence of WBC,  RBC, bacteria, casts, and other formed elements.  Only those elements seen were reported. . .   TSH Rfx on Abnormal to Free T4     Status: None   Collection Time: 04/23/24  8:37 AM  Result Value Ref Range   TSH 1.510 0.450 - 4.500 uIU/mL  Testosterone      Status: None   Collection Time: 04/23/24  8:37 AM  Result Value Ref Range   Testosterone  375.97 300.00 - 890.00 ng/dL  Microalbumin / creatinine urine ratio     Status: None   Collection Time: 04/23/24  8:37 AM  Result Value Ref Range   Microalb, Ur 0.7 0.0 - 1.9 mg/dL   Creatinine,U 794.7 mg/dL    Microalb Creat Ratio 3.6 0.0 - 30.0 mg/g  Magnesium     Status: None   Collection Time: 04/23/24  8:37 AM  Result Value Ref Range   Magnesium 2.0 1.5 - 2.5 mg/dL  Lipid panel     Status: Abnormal   Collection Time: 04/23/24  8:37 AM  Result Value Ref Range   Cholesterol 208 (H) 0 - 200 mg/dL    Comment: ATP III Classification       Desirable:  < 200 mg/dL               Borderline High:  200 - 239 mg/dL          High:  > = 759 mg/dL   Triglycerides 896.9 0.0 - 149.0 mg/dL    Comment: Normal:  <849 mg/dLBorderline High:  150 - 199 mg/dL   HDL 52.69 >60.99 mg/dL   VLDL 79.3 0.0 - 59.9 mg/dL   LDL Cholesterol 859 (H) 0 - 99 mg/dL   Total CHOL/HDL Ratio 4     Comment:                Men          Women1/2 Average Risk     3.4          3.3Average Risk          5.0          4.42X Average Risk          9.6          7.13X Average Risk          15.0          11.0                       NonHDL 160.85     Comment: NOTE:  Non-HDL goal should be 30 mg/dL higher than patient's LDL goal (i.e. LDL goal of < 70 mg/dL, would have non-HDL goal of < 100 mg/dL)  Insulin , random     Status: None   Collection Time: 04/23/24  8:37 AM  Result Value Ref Range   Insulin  15.7 uIU/mL    Comment:       Reference Range  < or = 18.4 .       Risk:       Optimal          < or = 18.4       Moderate         NA       High             >18.4 .  Adult cardiovascular event risk category       cut points (optimal, moderate, high)       are based on Insulin  Reference Interval       studies performed at [facility]       in 2022. SABRA   CRP High sensitivity     Status: None   Collection Time: 04/23/24  8:37 AM  Result Value Ref Range   CRP, High Sensitivity 3.270 0.000 - 5.000 mg/L    Comment: Note:  An elevated hs-CRP (>5 mg/L) should be repeated after 2 weeks to rule out recent infection or trauma.  C-peptide     Status: None   Collection Time: 04/23/24  8:37 AM  Result Value Ref Range   C-Peptide 1.82 0.80  - 3.85 ng/mL  Hemoglobin A1c     Status: None   Collection Time: 04/23/24  8:37 AM  Result Value Ref Range   Hgb A1c MFr Bld 6.0 4.6 - 6.5 %    Comment: Glycemic Control Guidelines for People with Diabetes:Non Diabetic:  <6%Goal of Therapy: <7%Additional Action Suggested:  >8%   Comprehensive metabolic panel with GFR     Status: None   Collection Time: 04/23/24  8:37 AM  Result Value Ref Range   Sodium 140 135 - 145 mEq/L   Potassium 4.0 3.5 - 5.1 mEq/L   Chloride 102 96 - 112 mEq/L   CO2 31 19 - 32 mEq/L    Comment: Elevated LDH levels may cause falsely increased CO2 results. If LDH is >2000 U/L, a positive bias of 12% is possible.   Glucose, Bld 85 70 - 99 mg/dL   BUN 14 6 - 23 mg/dL   Creatinine, Ser 8.86 0.40 - 1.50 mg/dL   Total Bilirubin 0.6 0.2 - 1.2 mg/dL   Alkaline Phosphatase 51 39 - 117 U/L   AST 31 0 - 37 U/L   ALT 49 0 - 53 U/L   Total Protein 7.7 6.0 - 8.3 g/dL   Albumin 4.6 3.5 - 5.2 g/dL   GFR 14.80 >39.99 mL/min    Comment: Calculated using the CKD-EPI Creatinine Equation (2021)   Calcium 10.1 8.4 - 10.5 mg/dL  REFLEXIVE URINE CULTURE     Status: None   Collection Time: 04/23/24  8:37 AM  Result Value Ref Range   Reflexve Urine Culture      Comment: NO CULTURE INDICATED        Beverley KATHEE Hummer, MD  I,Emily Lagle,acting as a scribe for Beverley KATHEE Hummer, MD.,have documented all relevant documentation on the behalf of Beverley KATHEE Hummer, MD.  LILLETTE Beverley KATHEE Hummer, MD, have reviewed all documentation for this visit. The documentation on 04/27/2024 for the exam, diagnosis, procedures, and orders are all accurate and complete.

## 2024-04-30 ENCOUNTER — Encounter: Payer: Self-pay | Admitting: Family Medicine

## 2024-04-30 DIAGNOSIS — R069 Unspecified abnormalities of breathing: Secondary | ICD-10-CM | POA: Diagnosis not present

## 2024-04-30 DIAGNOSIS — R29818 Other symptoms and signs involving the nervous system: Secondary | ICD-10-CM | POA: Insufficient documentation

## 2024-05-10 ENCOUNTER — Ambulatory Visit: Payer: Self-pay

## 2024-06-13 ENCOUNTER — Ambulatory Visit: Admitting: Orthopaedic Surgery
